# Patient Record
Sex: Male | Born: 1970 | Race: White | Hispanic: No | Marital: Married | Smoking: Never smoker
Health system: Southern US, Community
[De-identification: ages and names within clinical notes are randomized; demographics above are authoritative.]

## PROBLEM LIST (undated history)

## (undated) DIAGNOSIS — C801 Malignant (primary) neoplasm, unspecified: Secondary | ICD-10-CM

## (undated) DIAGNOSIS — C641 Malignant neoplasm of right kidney, except renal pelvis: Secondary | ICD-10-CM

## (undated) HISTORY — PX: KNEE SURGERY: SHX244

## (undated) HISTORY — PX: PARTIAL NEPHRECTOMY: SHX414

## (undated) HISTORY — PX: ANTERIOR CRUCIATE LIGAMENT REPAIR: SHX115

## (undated) HISTORY — PX: MENISCUS REPAIR: SHX5179

## (undated) HISTORY — PX: VASECTOMY: SHX75

---

## 2005-12-29 ENCOUNTER — Ambulatory Visit: Payer: Self-pay | Admitting: Otolaryngology

## 2006-07-04 ENCOUNTER — Ambulatory Visit: Payer: Self-pay | Admitting: General Practice

## 2006-08-29 ENCOUNTER — Ambulatory Visit: Payer: Self-pay | Admitting: Orthopaedic Surgery

## 2007-03-02 ENCOUNTER — Ambulatory Visit: Payer: Self-pay | Admitting: Otolaryngology

## 2009-07-18 ENCOUNTER — Ambulatory Visit: Payer: Self-pay | Admitting: Endocrinology

## 2009-07-22 ENCOUNTER — Ambulatory Visit: Payer: Self-pay | Admitting: Endocrinology

## 2009-09-19 ENCOUNTER — Ambulatory Visit: Payer: Self-pay | Admitting: Internal Medicine

## 2009-09-26 ENCOUNTER — Ambulatory Visit: Payer: Self-pay | Admitting: Internal Medicine

## 2009-10-06 ENCOUNTER — Ambulatory Visit: Payer: Self-pay | Admitting: Internal Medicine

## 2009-10-11 ENCOUNTER — Ambulatory Visit: Payer: Self-pay | Admitting: Oncology

## 2009-10-11 HISTORY — PX: PARTIAL NEPHRECTOMY: SHX414

## 2009-10-21 ENCOUNTER — Ambulatory Visit: Payer: Self-pay | Admitting: Oncology

## 2009-11-11 ENCOUNTER — Ambulatory Visit: Payer: Self-pay | Admitting: Oncology

## 2009-12-09 ENCOUNTER — Ambulatory Visit: Payer: Self-pay | Admitting: Internal Medicine

## 2009-12-29 ENCOUNTER — Ambulatory Visit: Payer: Self-pay | Admitting: Internal Medicine

## 2009-12-29 ENCOUNTER — Ambulatory Visit: Payer: Self-pay | Admitting: Unknown Physician Specialty

## 2010-08-04 ENCOUNTER — Ambulatory Visit: Payer: Self-pay | Admitting: Internal Medicine

## 2011-09-23 ENCOUNTER — Ambulatory Visit: Payer: Self-pay | Admitting: Internal Medicine

## 2012-11-09 ENCOUNTER — Emergency Department: Payer: Self-pay | Admitting: Emergency Medicine

## 2012-11-10 LAB — COMPREHENSIVE METABOLIC PANEL
Albumin: 3.9 g/dL (ref 3.4–5.0)
Alkaline Phosphatase: 67 U/L (ref 50–136)
Anion Gap: 10 (ref 7–16)
Chloride: 102 mmol/L (ref 98–107)
Creatinine: 1.1 mg/dL (ref 0.60–1.30)
EGFR (African American): >60
EGFR (Non-African Amer.): >60
Glucose: 143 mg/dL — ABNORMAL HIGH (ref 65–99)
SGOT(AST): 32 U/L (ref 15–37)
Total Protein: 7.5 g/dL (ref 6.4–8.2)

## 2012-11-10 LAB — CBC WITH DIFFERENTIAL/PLATELET
Basophil #: 0.1 10*3/uL (ref 0.0–0.1)
Basophil %: 0.7 %
Eosinophil #: 0.1 10*3/uL (ref 0.0–0.7)
HCT: 42.4 % (ref 40.0–52.0)
HGB: 15 g/dL (ref 13.0–18.0)
MCHC: 35.5 g/dL (ref 32.0–36.0)
MCV: 83 fL (ref 80–100)
Monocyte #: 0.9 x10 3/mm (ref 0.2–1.0)
Neutrophil #: 6.5 10*3/uL (ref 1.4–6.5)
Neutrophil %: 64.6 %
RBC: 5.12 10*6/uL (ref 4.40–5.90)
RDW: 13.7 % (ref 11.5–14.5)
WBC: 10.1 10*3/uL (ref 3.8–10.6)

## 2013-02-28 ENCOUNTER — Ambulatory Visit: Payer: Self-pay | Admitting: Orthopedic Surgery

## 2013-11-13 ENCOUNTER — Ambulatory Visit: Payer: Self-pay | Admitting: Orthopedic Surgery

## 2015-02-01 NOTE — Op Note (Signed)
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   PROCEDURE PERFORMED:  Arthroscopy, left knee, debridement of residual anterior cruciate ligament, extensive synovectomy, chondroplasty, partial medial meniscectomy.   SURGEON:  Dawayne Patricia M.D.   ASSISTANT:  None.   ANESTHESIA:  General anesthesia.   TOURNIQUET TIME:  Zero.  ESTIMATED BLOOD LOSS:  Minimal.   FINDINGS:  Grade IV chondral wear on the medial femoral condyle, grade II chondral wear medial tibial plateau. Grade II chondral wear lateral femoral condyle and lateral tibial plateau. Grade IV chondral wear femoral trochlea. Extensive synovitis. Chronic tear ACL with significant scarring of residual stump, complex free edge flap tear of posterior horn medial meniscus.   COMPLICATIONS:  None.   INDICATIONS FOR PROCEDURE:  Jesse Montgomery is a 44 year old gentleman who has an extensive history of injury to the left knee. He has undergone two ACL reconstruction and does not know at which point his second ligament reconstruction failed. He has had multiple episodes of instability of the knee. He has recently begun to have significant pain in the knee and episodes of his knee giving way and a catching and locking sensation. MRI revealed that the ACL was no longer present. MRI also revealed extensive degenerative changes as well as a medial meniscus tear. The patient opted to proceed with surgical intervention. The risks and benefits were explained to the patient. The patient decided to proceed with surgery.   DESCRIPTION OF PROCEDURE:  Jesse Montgomery was brought into the Operating Room. Placed on table in a supine position. General  anesthesia was administered. The left lower extremity was prepared and draped in the usual sterile fashion. A tourniquet was applied, however, it was not inflated. Surgical time-out was performed. The patient was identified, laterality was confirmed, consent form of confirmed, procedure, administration of IV antibiotics and appropriate skin preparation.   A standard lateral viewing portal was made. Arthroscope was inserted. Under direct visualization, a medial portal was made. The patellofemoral articulation was evaluated. The patient had very extensive synovitis. An extensive synovectomy was performed in order to achieve improve visualization. The undersurface of the patella was probed and found to be quite soft; however, there was no significant chondral loss. The femoral trochlear groove had very extensive grade IV chondral wear. This was probed and multiple areas of loose cartilage were found. These were gently debrided back with a chondral shaver back to a stable edge.   The medial compartment was entered. The medial compartment demonstrated grade IV chondral wear on the medial femoral condyle. Multiple loose fragments of cartilage were found. These were debrided back to a stable edge. The meniscus was probed. The posterior horn demonstrated a complex tear with a large, unstable free flap. This was debrided using a combination of meniscal biters and shaver. Attention was now turned to the notch. The patient demonstrated an empty lateral wall sign. The ACL was not present. There was a large residual stump of the ACL and that was scarred to the PCL and densely adherent to the lateral wall. This was debrided away. Prior to debriding, the patient's knee was extended and some of the residual ACL stump was found to be impinging on the notch. There was one area of very dense, firm-feeling  tissue that was removed using a grasper from the tibia. This was found to be consistent with a chondral loose body enveloped in  soft tissue. Attention was now turned to the lateral compartment. The lateral compartment demonstrated mild degenerative changes at approximately a grade II. The meniscus was probed and found to be stable with no evidence of tearing. At this time, the knee was copiously irrigated. The medial and lateral gutters were checked for any residual loose bodies. After any loose bodies were evacuated, the knee was again irrigated and drained. The portals were closed using 3-0 nylon suture. Sterile dressings were applied. The patient was awoken and taken to the postoperative care unit in stable condition.      He will follow up in the office on Friday and begin physical therapy the same day.   ____________________________ Dawayne Patricia, MD sr:jm D: 11/16/2013 15:24:11 ET T: 11/16/2013 18:07:52 ET JOB#: [id number]  cc: Dawayne Patricia, MD, <Dictator> Dawayne Patricia MD ELECTRONICALLY SIGNED 12/13/2013 8:49

## 2015-06-25 ENCOUNTER — Other Ambulatory Visit: Payer: Self-pay | Admitting: Physician Assistant

## 2015-06-25 DIAGNOSIS — R911 Solitary pulmonary nodule: Secondary | ICD-10-CM

## 2015-07-08 ENCOUNTER — Ambulatory Visit
Admission: RE | Admit: 2015-07-08 | Discharge: 2015-07-08 | Disposition: A | Discharge status: Home / Self Care | Payer: BLUE CROSS/BLUE SHIELD | Source: Ambulatory Visit | Attending: Physician Assistant | Admitting: Physician Assistant

## 2015-07-08 DIAGNOSIS — R911 Solitary pulmonary nodule: Secondary | ICD-10-CM | POA: Diagnosis not present

## 2015-07-08 HISTORY — DX: Malignant (primary) neoplasm, unspecified: C80.1

## 2015-07-08 MED ORDER — IOHEXOL 300 MG/ML  SOLN
75.0000 mL | Freq: Once | INTRAMUSCULAR | Status: AC | PRN
  Administered 2015-07-08: 75 mL via INTRAVENOUS

## 2015-09-09 ENCOUNTER — Encounter
Admission: RE | Disposition: A | Discharge status: Home / Self Care | Payer: Self-pay | Source: Ambulatory Visit | Attending: Internal Medicine

## 2015-09-09 ENCOUNTER — Ambulatory Visit
Admission: RE | Admit: 2015-09-09 | Discharge: 2015-09-09 | Disposition: A | Discharge status: Home / Self Care | Payer: BLUE CROSS/BLUE SHIELD | Source: Ambulatory Visit | Attending: Internal Medicine | Admitting: Internal Medicine

## 2015-09-09 DIAGNOSIS — R911 Solitary pulmonary nodule: Secondary | ICD-10-CM | POA: Diagnosis not present

## 2015-09-09 HISTORY — PX: FLEXIBLE BRONCHOSCOPY: SHX5094

## 2015-09-09 SURGERY — BRONCHOSCOPY, FLEXIBLE
Anesthesia: Moderate Sedation

## 2015-09-09 MED ORDER — SODIUM CHLORIDE 0.9 % IV SOLN
INTRAVENOUS | Status: DC
  Administered 2015-09-09: 12:00:00 via INTRAVENOUS

## 2015-09-09 MED ORDER — MIDAZOLAM HCL 2 MG/2ML IJ SOLN
INTRAMUSCULAR | Status: AC | PRN
  Administered 2015-09-09 (×2): 2 mg via INTRAVENOUS
  Administered 2015-09-09: 1 mg via INTRAVENOUS

## 2015-09-09 MED ORDER — MIDAZOLAM HCL 5 MG/5ML IJ SOLN
INTRAMUSCULAR | Status: AC
  Filled 2015-09-09: qty 10

## 2015-09-09 MED ORDER — FENTANYL CITRATE (PF) 100 MCG/2ML IJ SOLN
INTRAMUSCULAR | Status: AC
  Filled 2015-09-09: qty 2

## 2015-09-09 MED ORDER — FENTANYL CITRATE (PF) 100 MCG/2ML IJ SOLN
INTRAMUSCULAR | Status: AC | PRN
  Administered 2015-09-09 (×2): 50 ug via INTRAVENOUS

## 2015-09-09 MED ORDER — LIDOCAINE HCL 2 % EX GEL: 1.0000 "application " | Freq: Once | CUTANEOUS | Status: DC

## 2015-09-09 NOTE — Op Note (Signed)
[facility] Patient Name: Jesse Montgomery Procedure Date: 09/09/2015 12:07 PM MRN: [id number] Account #: [id number] Date of Birth: [birth year] Admit Type: Outpatient Age: 44 Room: [facility] PROCEDURE RM 01 Gender: Male Note Status: Finalized Attending MD: Allyne Gee, MD Procedure:         Bronchoscopy Indications:       Suspicious lung lesion Providers:         Allyne Gee, MD Referring MD:       Medicines:         Midazolam 5 mg IV, Fentanyl 123XX123 mcg IV Complications:     No immediate complications Procedure:         Pre-Anesthesia Assessment:                    - The risks and benefits of the procedure and the sedation                     options and risks were discussed with the patient. All                     questions were answered and informed consent was obtained.                    - A History and Physical has been performed. The patient's                     medications, allergies and sensitivities have been                     reviewed.                    - Pre-procedure physical examination revealed no                     contraindications to sedation.                    - ASA Grade Assessment: II - A patient with mild systemic                     disease.                    - After reviewing the risks and benefits, the patient was                     deemed in satisfactory condition to undergo the procedure.                    - The anesthesia plan was to use moderate                     sedation/analgesia.                    - Immediately prior to administration of medications, the                     patient was re-assessed for adequacy to receive sedatives.                    - The heart rate, respiratory rate, oxygen saturations,                     blood pressure, adequacy of pulmonary ventilation, and  response to care were monitored throughout the procedure.                    - The physical status of the patient was  re-assessed after                     the procedure.                    After obtaining informed consent, the bronchoscope was                     passed under direct vision. Throughout the procedure, the                     patient's blood pressure, pulse, and oxygen saturations                     were monitored continuously. the Bronchoscope Olympus                     BF-Q180 S# [id number] was introduced through the mouth and                     advanced to the tracheobronchial tree of both lungs. The                     procedure was accomplished without difficulty. The patient                     tolerated the procedure well. The total duration of the                     procedure was 22 minutes. Findings:      The nasopharynx/oropharynx appears normal. The larynx appears normal.       The vocal cords move normally with phonation and breathing. The       subglottic space is normal. The trachea is of normal caliber. The carina       is sharp. The tracheobronchial tree was examined to at least the first       subsegmental level. Bronchial mucosa and anatomy are normal; there are       no endobronchial lesions, and no secretions. There was no evidence of       significant pathology. Washings were obtained in the right middle lobe       and sent for cell count, bacterial culture, viral smears & culture, and       fungal & AFB analysis and cytology. The return was clear. Impression:        - The examination was normal.                    - Washings were obtained. Recommendation:    - Await culture and washing results. Devona Konig, MD Allyne Gee, MD 09/09/2015 12:49:27 PM This report has been signed electronically. Number of Addenda: 0 Note Initiated On: 09/09/2015 12:07 PM      [facility]

## 2015-09-09 NOTE — H&P (Signed)
Pulmonary Critical Care  Procedure H&P   Jesse Montgomery [id number] DOB: [birth year] DOA: 09/09/2015  PCP: Lenard Simmer, MD   Chief Complaint: here for bronchoscopy  HPI: Jesse Montgomery is a 44 y.o. male with prior history of renal carcinoma presents for a bronchoscopy. Please prior detailed H&P in chart. He is doing well with no significant changes or complaints at this time. Patient is comfortable and will proceed with the procedure   Review of Systems:  Complete ROS is unremarkable other than noted in HPI  Past Medical History  Diagnosis Date  . Cancer [facility])    History reviewed. No pertinent past surgical history. Social History:  reports that he has never smoked. He does not have any smokeless tobacco history on file. He reports that he does not use illicit drugs. His alcohol history is not on file.  Not on File  History reviewed. No pertinent family history.  Prior to Admission medications   Not on File   Physical Exam: Filed Vitals:   09/09/15 1120  BP: 144/98  Pulse: 76  Temp: 98.6 F (37 C)  TempSrc: Oral  Resp: 17  Height: 5\' 10"  (1.778 m)  Weight: 99.791 kg (220 lb)  SpO2: 95%    Wt Readings from Last 3 Encounters:  09/09/15 99.791 kg (220 lb)    General:  Appears calm and comfortable Eyes: PERRL, normal lids, irises & conjunctiva ENT: grossly normal hearing, lips & tongue Neurologic: grossly non-focal. Remainder exam unchanged          Labs on Admission:  Basic Metabolic Panel: No results for input(s): NA, K, CL, CO2, GLUCOSE, BUN, CREATININE, CALCIUM, MG, PHOS in the last 168 hours. Liver Function Tests: No results for input(s): AST, ALT, ALKPHOS, BILITOT, PROT, ALBUMIN in the last 168 hours. No results for input(s): LIPASE, AMYLASE in the last 168 hours. No results for input(s): AMMONIA in the last 168 hours. CBC: No results for input(s): WBC, NEUTROABS, HGB, HCT, MCV, PLT in the last 168 hours. Cardiac Enzymes: No results for  input(s): CKTOTAL, CKMB, CKMBINDEX, TROPONINI in the last 168 hours.  BNP (last 3 results) No results for input(s): BNP in the last 8760 hours.  ProBNP (last 3 results) No results for input(s): PROBNP in the last 8760 hours.  CBG: No results for input(s): GLUCAP in the last 168 hours.  Radiological Exams on Admission: No results found.  EKG: Independently reviewed.  Assessment/Plan Active Problems:   * No active hospital problems. *   1. Bronchoscopy -patient to have bronch today will proceed       I have personally obtained a history, examined the patient.   Allyne Gee, MD [facility] Pulmonary Critical Care Medicine Sleep Medicine

## 2015-09-09 NOTE — Sedation Documentation (Signed)
Patient received total of 5mg  versed and 170mcg of fentanyl during procedure. Tolerated well, will transfer back to [facility] for recovery.

## 2015-09-09 NOTE — Progress Notes (Signed)
Tlerated procedure well. Awake, alert, oriented x 4. Reporting no pain. VSS and have returned to baseline. Discharge reviewed with patient and wife. Discharge to home per orders. Will escort out via wheelchair.

## 2015-09-13 LAB — CULTURE, RESPIRATORY

## 2015-09-13 LAB — CULTURE, RESPIRATORY W GRAM STAIN

## 2015-09-30 LAB — FUNGUS CULTURE W SMEAR

## 2015-10-23 LAB — ACID FAST SMEAR+CULTURE W/RFLX (ARMC ONLY)
ACID FAST SMEAR: NEGATIVE
Acid Fast Culture: NEGATIVE

## 2020-09-10 HISTORY — PX: TOTAL KNEE ARTHROPLASTY: SHX125

## 2021-08-05 ENCOUNTER — Encounter: Payer: Self-pay | Admitting: Emergency Medicine

## 2021-08-05 ENCOUNTER — Emergency Department: Payer: Managed Care, Other (non HMO)

## 2021-08-05 ENCOUNTER — Other Ambulatory Visit: Payer: Self-pay

## 2021-08-05 DIAGNOSIS — I10 Essential (primary) hypertension: Secondary | ICD-10-CM | POA: Insufficient documentation

## 2021-08-05 DIAGNOSIS — G43009 Migraine without aura, not intractable, without status migrainosus: Secondary | ICD-10-CM | POA: Diagnosis not present

## 2021-08-05 DIAGNOSIS — Z859 Personal history of malignant neoplasm, unspecified: Secondary | ICD-10-CM | POA: Insufficient documentation

## 2021-08-05 DIAGNOSIS — R519 Headache, unspecified: Secondary | ICD-10-CM | POA: Diagnosis present

## 2021-08-05 LAB — BASIC METABOLIC PANEL
Anion gap: 10 (ref 5–15)
BUN: 19 mg/dL (ref 6–20)
CO2: 27 mmol/L (ref 22–32)
Calcium: 9.1 mg/dL (ref 8.9–10.3)
Chloride: 98 mmol/L (ref 98–111)
Creatinine, Ser: 1.15 mg/dL (ref 0.61–1.24)
GFR, Estimated: >60 mL/min (ref >60)
Glucose, Bld: 201 mg/dL — ABNORMAL HIGH (ref 70–99)
Potassium: 3.8 mmol/L (ref 3.5–5.1)
Sodium: 135 mmol/L (ref 135–145)

## 2021-08-05 LAB — CBC
HCT: 43.3 % (ref 39.0–52.0)
Hemoglobin: 16 g/dL (ref 13.0–17.0)
MCH: 29.7 pg (ref 26.0–34.0)
MCHC: 37 g/dL — ABNORMAL HIGH (ref 30.0–36.0)
MCV: 80.5 fL (ref 80.0–100.0)
Platelets: 219 10*3/uL (ref 150–400)
RBC: 5.38 MIL/uL (ref 4.22–5.81)
RDW: 12.6 % (ref 11.5–15.5)
WBC: 10.7 10*3/uL — ABNORMAL HIGH (ref 4.0–10.5)
nRBC: 0 % (ref 0.0–0.2)

## 2021-08-05 IMAGING — CT CT HEAD W/O CM
4 series · 16 of 47 positions shown, 18 images · non-contrast
Comparison: November 09, 2012

CLINICAL DATA: Headache and hypertension.

EXAM:
CT HEAD WITHOUT CONTRAST
TECHNIQUE: Contiguous axial images were obtained from the base of the skull
through the vertex without intravenous contrast.

[Series 2: head bone · axial · 0.46mm/px · z∈[-116,-84]mm · 3 of 79 slices shown]
[im 8/79  bone]
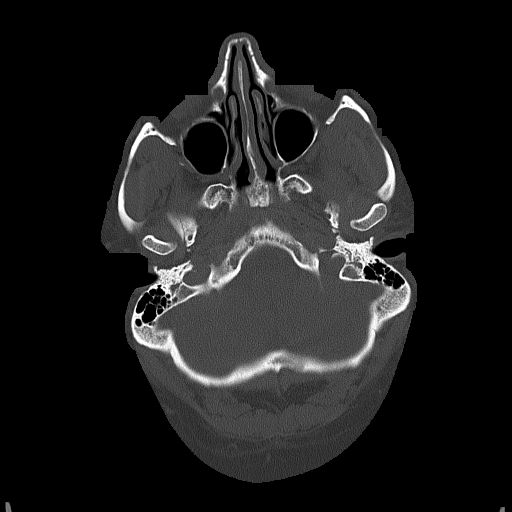
[im 16/79  bone]
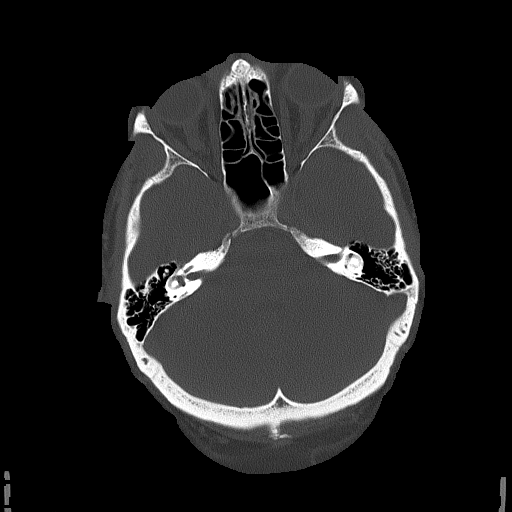
[im 24/79  bone]
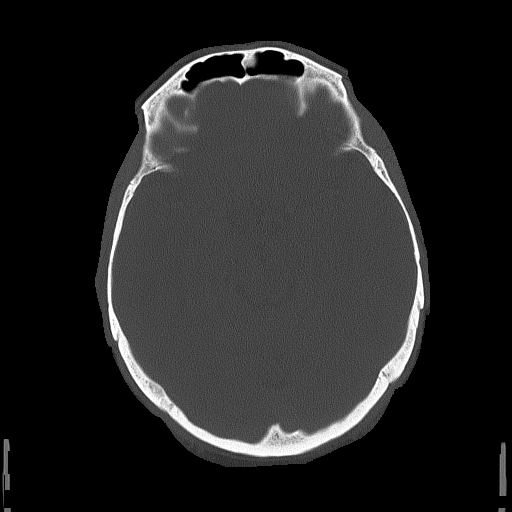

[Series 3: head wo · axial · 0.46mm/px · z∈[-115,+5]mm · 7 of 32 slices shown, 9 images]
[im 4/32  brain]
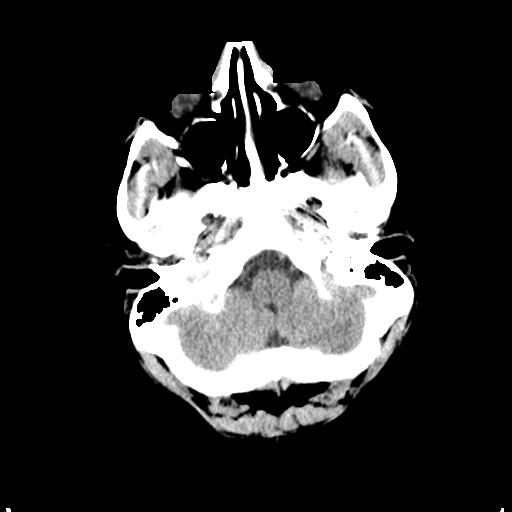
[im 4/32  bone]
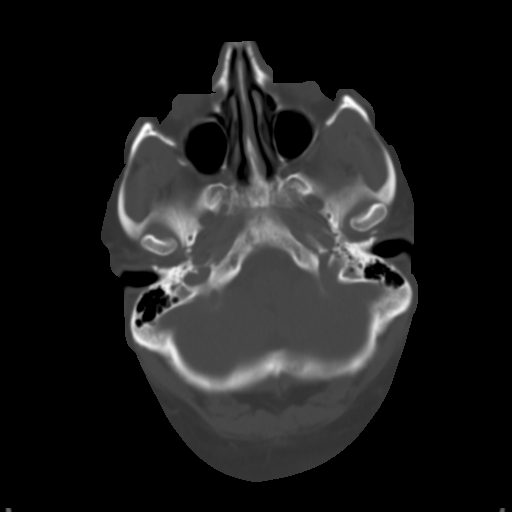
[im 8/32  brain]
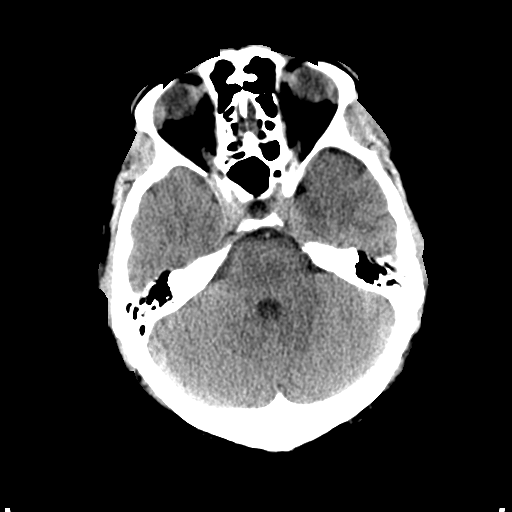
[im 12/32  brain]
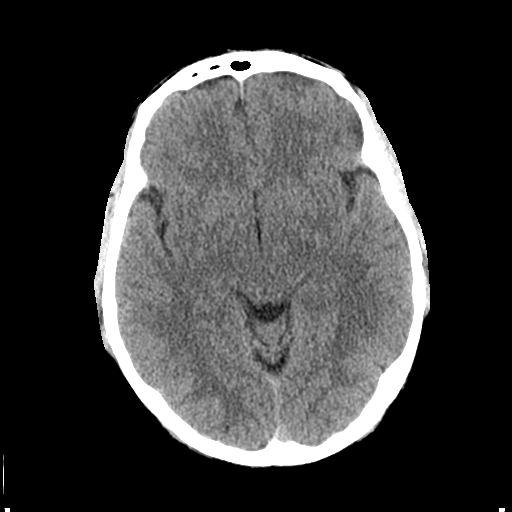
[im 16/32  brain]
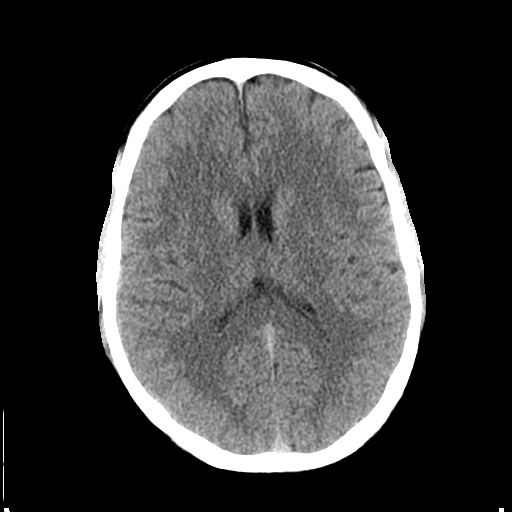
[im 20/32  brain]
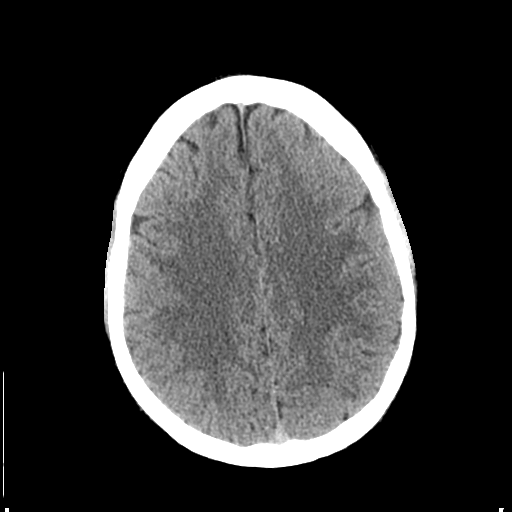
[im 20/32  bone]
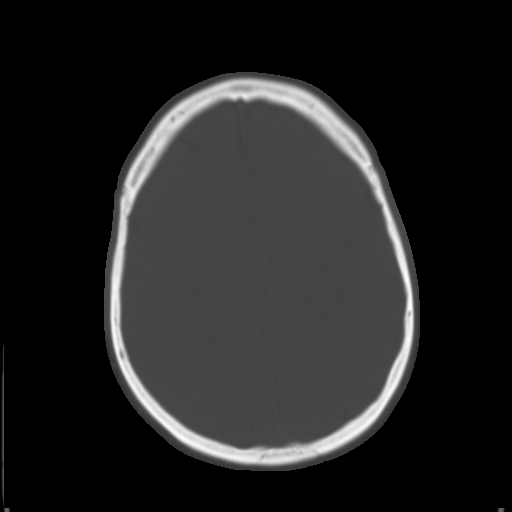
[im 24/32  brain]
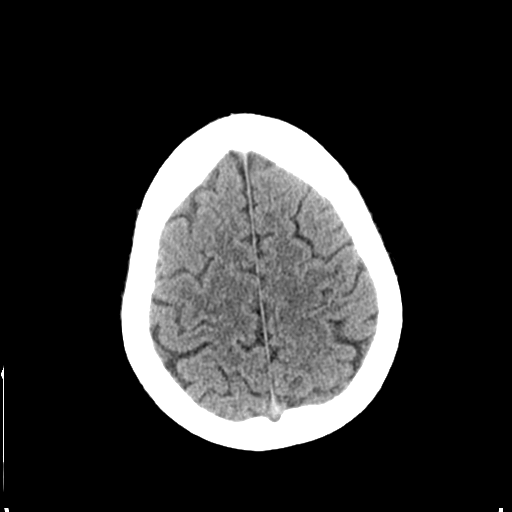
[im 28/32  brain]
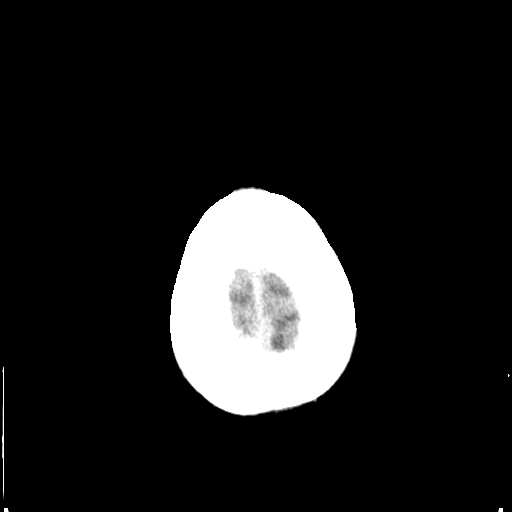

[Series 4: coronal soft tissue · coronal · 0.30mm/px · 3 of 71 slices shown]
[im 24/71  brain]
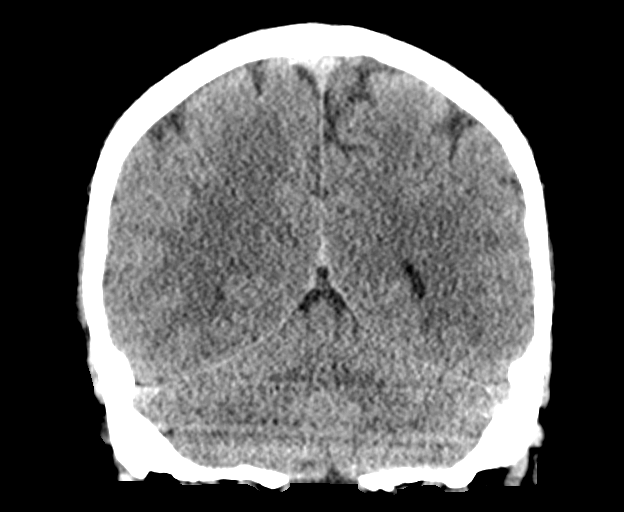
[im 32/71  brain]
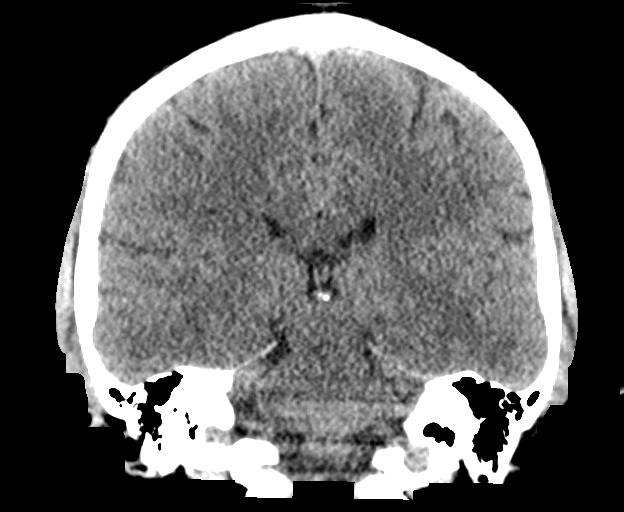
[im 39/71  brain]
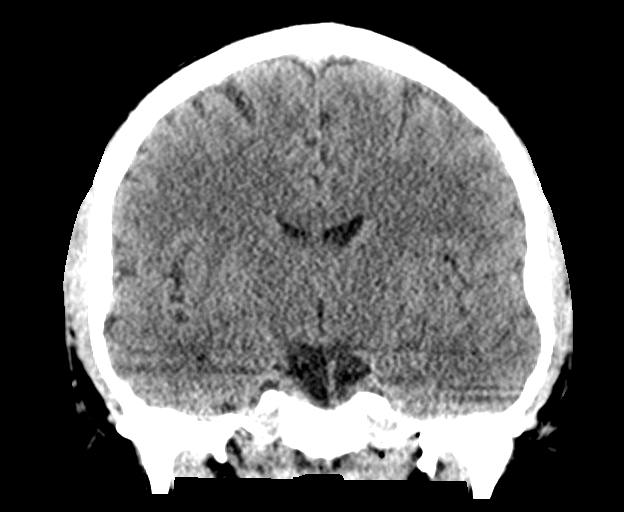

[Series 5: sagittal soft tissue · sagittal · 0.30mm/px · 3 of 63 slices shown]
[im 21/63  brain]
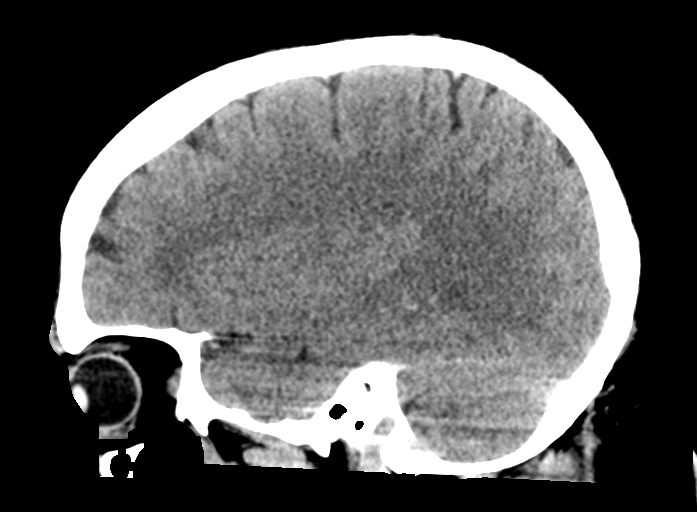
[im 32/63  brain]
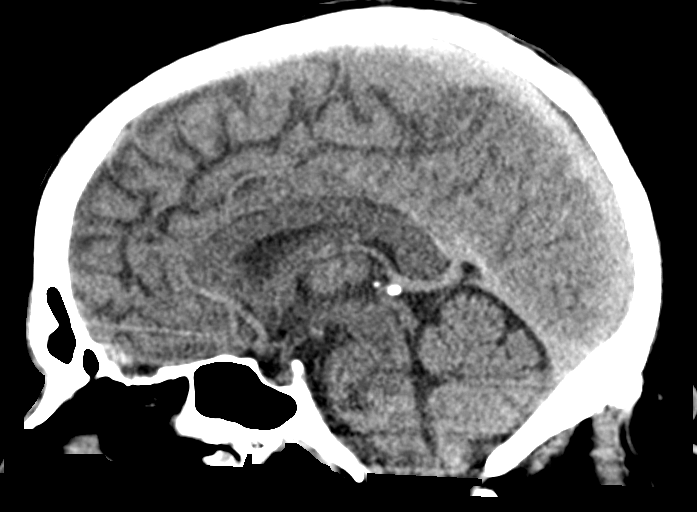
[im 42/63  brain]
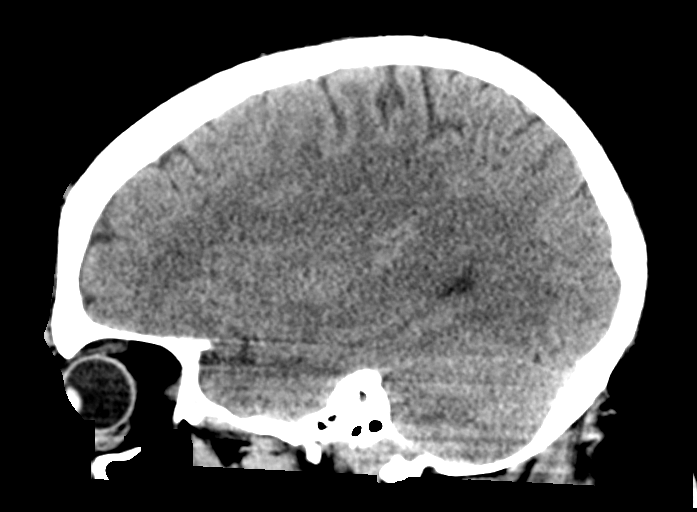

[16 of 47 positions shown; findings below may reference images not displayed]

FINDINGS: Brain: No evidence of acute infarction, hemorrhage, hydrocephalus,
extra-axial collection or mass lesion/mass effect.

Vascular: No hyperdense vessel or unexpected calcification.

Skull: Normal. Negative for fracture or focal lesion.

Sinuses/Orbits: No acute finding.

Other: None.
IMPRESSION: No acute intracranial pathology.

## 2021-08-05 MED ORDER — ONDANSETRON 4 MG PO TBDP
4.0000 mg | ORAL_TABLET | Freq: Once | ORAL | Status: AC
  Administered 2021-08-05: 4 mg via ORAL
  Filled 2021-08-05: qty 1

## 2021-08-05 NOTE — ED Provider Notes (Signed)
Emergency Medicine Provider Triage Evaluation Note  Jesse Montgomery , a 50 y.o. male  was evaluated in triage.  Pt complains of severe headache, pressure that began around 4 PM.  An hour and a half later he developed vomiting.  Has had several episodes of vomiting.  Denies any vision changes, fevers, chills or body aches.  No focal weakness.  He is feeling weak after several episodes of vomiting.  Wife is noted he has been hypertensive around 160/105.  No history of hypertension.  Patient states this is the worst headache of his life.  Review of Systems  Positive: Headache, vomiting Negative: Vision changes, chest pain, shortness of breath, numbness, neck pain  Physical Exam  BP (!) 165/105 (BP Location: Left Arm)   Pulse 71   Temp 98.2 F (36.8 C) (Oral)   Resp 16   Ht 5\' 9"  (1.753 m)   Wt 102.1 kg   SpO2 96%   BMI 33.23 kg/m  Gen:   Awake, no distress   Resp:  Normal effort  MSK:   Moves extremities without difficulty  Other:    Medical Decision Making  Medically screening exam initiated at 10:01 PM.  Appropriate orders placed.  FLAVIO LINDROTH was informed that the remainder of the evaluation will be completed by another provider, this initial triage assessment does not replace that evaluation, and the importance of remaining in the ED until their evaluation is complete.  Ordered CT of the head and basic blood work.  Will give Zofran for nausea.   Renata Caprice 08/05/21 2206    Merlyn Lot, MD 08/05/21 [phone]

## 2021-08-05 NOTE — ED Triage Notes (Signed)
Pt to ED from home c/o headache and HTN that started today.  States second episode this week.  Had vomiting x6 today.  Denies injury, fever, or other pain.  Pt A&Ox4, no weakness or numbness.  Took tylenol around 1600 today.  Denies hx of HTN, checked BP at home because wife has machine.

## 2021-08-06 ENCOUNTER — Emergency Department
Admission: EM | Admit: 2021-08-06 | Discharge: 2021-08-06 | Disposition: A | Discharge status: Home / Self Care | Payer: Managed Care, Other (non HMO) | Attending: Student in an Organized Health Care Education/Training Program | Admitting: Student in an Organized Health Care Education/Training Program

## 2021-08-06 ENCOUNTER — Telehealth: Payer: Self-pay | Admitting: Emergency Medicine

## 2021-08-06 DIAGNOSIS — G43009 Migraine without aura, not intractable, without status migrainosus: Secondary | ICD-10-CM

## 2021-08-06 DIAGNOSIS — R112 Nausea with vomiting, unspecified: Secondary | ICD-10-CM

## 2021-08-06 DIAGNOSIS — R519 Headache, unspecified: Secondary | ICD-10-CM

## 2021-08-06 DIAGNOSIS — I10 Essential (primary) hypertension: Secondary | ICD-10-CM

## 2021-08-06 MED ORDER — ONDANSETRON 4 MG PO TBDP: 4.0000 mg | ORAL_TABLET | Freq: Three times a day (TID) | ORAL | 0 refills | Status: DC | PRN

## 2021-08-06 MED ORDER — BUTALBITAL-APAP-CAFFEINE 50-325-40 MG PO TABS: 1.0000 | ORAL_TABLET | Freq: Four times a day (QID) | ORAL | 0 refills | Status: DC | PRN

## 2021-08-06 MED ORDER — BUTALBITAL-APAP-CAFFEINE 50-325-40 MG PO TABS
2.0000 | ORAL_TABLET | Freq: Once | ORAL | Status: AC
  Administered 2021-08-06: 2 via ORAL
  Filled 2021-08-06: qty 2

## 2021-08-06 NOTE — ED Notes (Signed)
Patient discharged to home per MD order. Patient in stable condition, and deemed medically cleared by ED provider for discharge. Discharge instructions reviewed with patient/family using "Teach Back"; verbalized understanding of medication education and administration, and information about follow-up care. Denies further concerns. ° °

## 2021-08-06 NOTE — ED Provider Notes (Signed)
[facility] Emergency Department Provider Note   ____________________________________________   Event Date/Time   First MD Initiated Contact with Patient 08/05/21 2201     (approximate)  I have reviewed the triage vital signs and the nursing notes.   HISTORY  Chief Complaint Hypertension and Headache    HPI Jesse Montgomery is a 50 y.o. male who presents for headache  LOCATION: Generalized head DURATION: Began at approximately 1600 today TIMING: Stable since onset SEVERITY: Severe QUALITY: Head pressure CONTEXT: Patient states that beginning around 4 PM today he began having a severe headache and an hour and a half later developed vomiting as well.  Patient's wife also notes him to be hypertensive without any history of hypertension MODIFYING FACTORS: Denies any exacerbating or relieving factors ASSOCIATED SYMPTOMS: Nausea/vomiting, hypertension   Per medical record review, patient has no contributory past medical history          Past Medical History:  Diagnosis Date   Cancer ([place])     There are no problems to display for this patient.   Past Surgical History:  Procedure Laterality Date   FLEXIBLE BRONCHOSCOPY N/A 09/09/2015   Procedure: FLEXIBLE BRONCHOSCOPY;  Surgeon: Allyne Gee, MD;  Location: [facility] ORS;  Service: Pulmonary;  Laterality: N/A;   KNEE SURGERY Left    PARTIAL NEPHRECTOMY      Prior to Admission medications   Medication Sig Start Date End Date Taking? Authorizing Provider  butalbital-acetaminophen-caffeine (FIORICET) 50-325-40 MG tablet Take 1-2 tablets by mouth every 6 (six) hours as needed for headache. 08/06/21 08/06/22 Yes Rukaya Kleinschmidt, Vista Lawman, MD  ondansetron (ZOFRAN ODT) 4 MG disintegrating tablet Take 1 tablet (4 mg total) by mouth every 8 (eight) hours as needed for nausea or vomiting. 08/06/21  Yes Naaman Plummer, MD    Allergies Patient has no known allergies.  History reviewed. No pertinent family  history.  Social History Social History   Tobacco Use   Smoking status: Never   Smokeless tobacco: Never  Substance Use Topics   Alcohol use: Yes    Alcohol/week: 8.0 standard drinks    Types: 8 Cans of beer per week    Comment: occ   Drug use: No    Review of Systems Constitutional: No fever/chills Eyes: No visual changes. ENT: No sore throat. Cardiovascular: Denies chest pain. Respiratory: Denies shortness of breath. Gastrointestinal: No abdominal pain.  Endorses nausea and vomiting.  No diarrhea. Genitourinary: Negative for dysuria. Musculoskeletal: Negative for acute arthralgias Skin: Negative for rash. Neurological: Positive for headache, weakness/numbness/paresthesias in any extremity Psychiatric: Negative for suicidal ideation/homicidal ideation   ____________________________________________   PHYSICAL EXAM:  VITAL SIGNS: ED Triage Vitals [08/05/21 2200]  Enc Vitals Group     BP (!) 165/105     Pulse Rate 71     Resp 16     Temp 98.2 F (36.8 C)     Temp Source Oral     SpO2 96 %     Weight 225 lb (102.1 kg)     Height 5\' 9"  (1.753 m)     Head Circumference      Peak Flow      Pain Score 10     Pain Loc      Pain Edu?      Excl. in [place]?    Constitutional: Alert and oriented. Well appearing and in no acute distress. Eyes: Conjunctivae are normal. PERRL. Head: Atraumatic. Nose: No congestion/rhinnorhea. Mouth/Throat: Mucous membranes are moist. Neck: No stridor  Cardiovascular: Grossly normal heart sounds.  Good peripheral circulation. Respiratory: Normal respiratory effort.  No retractions. Gastrointestinal: Soft and nontender. No distention. Musculoskeletal: No obvious deformities Neurologic:  Normal speech and language. No gross focal neurologic deficits are appreciated. Skin:  Skin is warm and dry. No rash noted. Psychiatric: Mood and affect are normal. Speech and behavior are normal.  ____________________________________________    LABS (all labs ordered are listed, but only abnormal results are displayed)  Labs Reviewed  CBC - Abnormal; Notable for the following components:      Result Value   WBC 10.7 (*)    MCHC 37.0 (*)    All other components within normal limits  BASIC METABOLIC PANEL - Abnormal; Notable for the following components:   Glucose, Bld 201 (*)    All other components within normal limits    RADIOLOGY  ED MD interpretation: CT of the head without contrast shows no evidence of acute abnormalities including no intracerebral hemorrhage, obvious masses, or significant edema  Official radiology report(s): CT Head Wo Contrast  Result Date: 08/05/2021 CLINICAL DATA:  Headache and hypertension. EXAM: CT HEAD WITHOUT CONTRAST TECHNIQUE: Contiguous axial images were obtained from the base of the skull through the vertex without intravenous contrast. COMPARISON:  November 09, 2012 FINDINGS: Brain: No evidence of acute infarction, hemorrhage, hydrocephalus, extra-axial collection or mass lesion/mass effect. Vascular: No hyperdense vessel or unexpected calcification. Skull: Normal. Negative for fracture or focal lesion. Sinuses/Orbits: No acute finding. Other: None. IMPRESSION: No acute intracranial pathology. Electronically Signed   By: Virgina Norfolk M.D.   On: 08/05/2021 22:22    ____________________________________________   PROCEDURES  Procedure(s) performed (including Critical Care):  Procedures   ____________________________________________   INITIAL IMPRESSION / ASSESSMENT AND PLAN / ED COURSE  As part of my medical decision making, I reviewed the following data within the electronic medical record, if available:  Nursing notes reviewed and incorporated, Labs reviewed, EKG interpreted, Old chart reviewed, Radiograph reviewed and Notes from prior ED visits reviewed and incorporated      50 year old male presents with Headache.  No focal neurological symptoms. Neuro exam is benign. Pt  is nontoxic. VSS.  Based on history and normal neurological exam I have low suspicion for intracranial tumor, intracranial bleed, meningitis, temporal arteritis, glaucoma, CO poisoning.  Most likely patient has benign headache/migraine, recommend rest, hydration, and ibuprofen.  Disposition: Discharge home with strict return precautions and instructions for prompt primary care follow up.      ____________________________________________   FINAL CLINICAL IMPRESSION(S) / ED DIAGNOSES  Final diagnoses:  Bad headache  Migraine without aura and without status migrainosus, not intractable  Nausea and vomiting, unspecified vomiting type  Primary hypertension     ED Discharge Orders          Ordered    butalbital-acetaminophen-caffeine (FIORICET) 50-325-40 MG tablet  Every 6 hours PRN        08/06/21 0228    ondansetron (ZOFRAN ODT) 4 MG disintegrating tablet  Every 8 hours PRN        08/06/21 0228    butalbital-acetaminophen-caffeine (FIORICET) 50-325-40 MG tablet  Every 6 hours PRN        Pending             Note:  This document was prepared using Dragon voice recognition software and may include unintentional dictation errors.    Naaman Plummer, MD 08/06/21 [phone]

## 2021-08-06 NOTE — Addendum Note (Signed)
Addended by: Delman Kitten on: 08/06/2021 09:53 AM   Modules accepted: Orders

## 2021-08-06 NOTE — Telephone Encounter (Addendum)
[facility] called advised they do not have a pharmacist today.  Patient unable to get his prescription filled at that location.  Will provide printed prescriptions for prescriptions provided earlier by Dr. Cheri Fowler  Update: Due to unclear error, unable to submit to pharmacy or print prescription for patient despite repeat attempts. IT contacted and Charge RN Mickel Baas) made aware.

## 2022-03-09 ENCOUNTER — Other Ambulatory Visit: Payer: Self-pay

## 2022-03-09 ENCOUNTER — Emergency Department
Admission: EM | Admit: 2022-03-09 | Discharge: 2022-03-09 | Disposition: A | Discharge status: Home / Self Care | Payer: Managed Care, Other (non HMO) | Attending: Emergency Medicine | Admitting: Emergency Medicine

## 2022-03-09 ENCOUNTER — Encounter: Payer: Self-pay | Admitting: Radiology

## 2022-03-09 ENCOUNTER — Emergency Department: Payer: Managed Care, Other (non HMO)

## 2022-03-09 DIAGNOSIS — R109 Unspecified abdominal pain: Secondary | ICD-10-CM

## 2022-03-09 DIAGNOSIS — K429 Umbilical hernia without obstruction or gangrene: Secondary | ICD-10-CM | POA: Diagnosis not present

## 2022-03-09 LAB — URINALYSIS, ROUTINE W REFLEX MICROSCOPIC
Bacteria, UA: NONE SEEN
Bilirubin Urine: NEGATIVE
Glucose, UA: 50 mg/dL — AB
Hgb urine dipstick: NEGATIVE
Ketones, ur: NEGATIVE mg/dL
Leukocytes,Ua: NEGATIVE
Nitrite: NEGATIVE
Protein, ur: 30 mg/dL — AB
Specific Gravity, Urine: 1.03 (ref 1.005–1.030)
pH: 5 (ref 5.0–8.0)

## 2022-03-09 LAB — CBC
HCT: 42.1 % (ref 39.0–52.0)
Hemoglobin: 14.6 g/dL (ref 13.0–17.0)
MCH: 28.9 pg (ref 26.0–34.0)
MCHC: 34.7 g/dL (ref 30.0–36.0)
MCV: 83.2 fL (ref 80.0–100.0)
Platelets: 213 10*3/uL (ref 150–400)
RBC: 5.06 MIL/uL (ref 4.22–5.81)
RDW: 12.5 % (ref 11.5–15.5)
WBC: 8.7 10*3/uL (ref 4.0–10.5)
nRBC: 0 % (ref 0.0–0.2)

## 2022-03-09 LAB — COMPREHENSIVE METABOLIC PANEL
ALT: 26 U/L (ref 0–44)
AST: 21 U/L (ref 15–41)
Albumin: 4.2 g/dL (ref 3.5–5.0)
Alkaline Phosphatase: 62 U/L (ref 38–126)
Anion gap: 10 (ref 5–15)
BUN: 18 mg/dL (ref 6–20)
CO2: 27 mmol/L (ref 22–32)
Calcium: 9.1 mg/dL (ref 8.9–10.3)
Chloride: 101 mmol/L (ref 98–111)
Creatinine, Ser: 0.9 mg/dL (ref 0.61–1.24)
GFR, Estimated: >60 mL/min (ref >60)
Glucose, Bld: 170 mg/dL — ABNORMAL HIGH (ref 70–99)
Potassium: 4 mmol/L (ref 3.5–5.1)
Sodium: 138 mmol/L (ref 135–145)
Total Bilirubin: 1 mg/dL (ref 0.3–1.2)
Total Protein: 7.3 g/dL (ref 6.5–8.1)

## 2022-03-09 LAB — LIPASE, BLOOD: Lipase: 24 U/L (ref 11–51)

## 2022-03-09 MED ORDER — ONDANSETRON HCL 4 MG/2ML IJ SOLN
4.0000 mg | Freq: Once | INTRAMUSCULAR | Status: AC
  Administered 2022-03-09: 4 mg via INTRAVENOUS
  Filled 2022-03-09: qty 2

## 2022-03-09 MED ORDER — MORPHINE SULFATE (PF) 4 MG/ML IV SOLN
4.0000 mg | Freq: Once | INTRAVENOUS | Status: AC
  Administered 2022-03-09: 4 mg via INTRAVENOUS
  Filled 2022-03-09: qty 1

## 2022-03-09 MED ORDER — IOHEXOL 300 MG/ML  SOLN
75.0000 mL | Freq: Once | INTRAMUSCULAR | Status: AC | PRN
  Administered 2022-03-09: 75 mL via INTRAVENOUS

## 2022-03-09 MED ORDER — SODIUM CHLORIDE 0.9 % IV BOLUS
1000.0000 mL | Freq: Once | INTRAVENOUS | Status: AC
  Administered 2022-03-09: 1000 mL via INTRAVENOUS

## 2022-03-09 NOTE — ED Notes (Signed)
Pt to ED for abdominal pain and periumbilical hernia which was already reduced by EDP. NAD at  this time but continues to have pain 5/10.

## 2022-03-09 NOTE — ED Triage Notes (Signed)
Pt here from [facility] with abd pain and a possible hernia. Pt states his belly button "popped out" on its own. Pt denies N/V/D.

## 2022-03-09 NOTE — ED Provider Notes (Signed)
[facility] Provider Note    Event Date/Time   First MD Initiated Contact with Patient 03/09/22 1141     (approximate)  History   Chief Complaint: Abdominal Pain and Hernia  HPI  Jesse Montgomery is a 51 y.o. male who presents to the emergency department for a mass and pain around his bellybutton.  According to the patient around 24 hours ago he states he felt his bellybutton pushed out which has become painful over the past 24 hours.  Patient states last bowel movement was yesterday.  No nausea or vomiting.  No history of hernia previously.  Physical Exam   Triage Vital Signs: ED Triage Vitals  Enc Vitals Group     BP 03/09/22 1112 (!) 139/97     Pulse Rate 03/09/22 1112 69     Resp 03/09/22 1112 18     Temp 03/09/22 1112 98.5 F (36.9 C)     Temp Source 03/09/22 1112 Oral     SpO2 03/09/22 1112 96 %     Weight 03/09/22 1113 218 lb (98.9 kg)     Height 03/09/22 1113 '5\' 9"'$  (1.753 m)     Head Circumference --      Peak Flow --      Pain Score 03/09/22 1113 7     Pain Loc --      Pain Edu? --      Excl. in [place]? --     Most recent vital signs: Vitals:   03/09/22 1112  BP: (!) 139/97  Pulse: 69  Resp: 18  Temp: 98.5 F (36.9 C)  SpO2: 96%    General: Awake, no distress.  CV:  Good peripheral perfusion.  Regular rate and rhythm  Resp:  Normal effort.  Equal breath sounds bilaterally.  Abd:  No distention.  Soft, mass palpated just inferior to umbilicus consistent with likely umbilical hernia.  Moderate tenderness to palpation no skin color changes.    ED Results / Procedures / Treatments   RADIOLOGY  I have reviewed the CT images patient does appear to have a small umbilical hernia remaining on my interpretation.  IMPRESSION:  1. Small umbilical hernia containing fat, enlarged from the prior  abdominal CT. The fat in this hernia appears edematous suggesting  possible incarceration. No herniated bowel. Correlate clinically.  2. Small  low-density renal lesions are likely cysts, although are  incompletely characterized based on size. Correlate with surgical  history and reason for previous partial right nephrectomy; in the  absence of clinically indicated signs/symptoms the current findings  require no independent follow-up.    MEDICATIONS ORDERED IN ED: Medications  morphine (PF) 4 MG/ML injection 4 mg (has no administration in time range)  ondansetron (ZOFRAN) injection 4 mg (has no administration in time range)  sodium chloride 0.9 % bolus 1,000 mL (has no administration in time range)     IMPRESSION / MDM / ASSESSMENT AND PLAN / ED COURSE  I reviewed the triage vital signs and the nursing notes.  Patient's presentation is most consistent with acute complicated illness / injury requiring diagnostic workup.  Patient presents to the emergency department for abdominal pain around the umbilicus with what appears to be a palpable umbilical hernia.  No history of hernias previously.  With sustained pressure I was able to reduce the hernia at the bedside.  Patient continues to have abdominal pain however we will obtain CT images to rule out any remaining hernia or bowel injury.  Reassuringly patient's  labs are normal including a normal CBC with a normal white blood cell count, normal chemistry and a negative lipase.  We will treat pain nausea and IV hydrate while awaiting CT imaging.  Patient agreeable to plan of care.  Patient is feeling much better.  Patient's CT scan shows umbilical hernia with some fat.  The hernia appears edematous suggesting possible incarceration although I believe this is probably likely prior to my reduction as the umbilicus is now soft.  I discussed the renal lesions with PCP follow-up.  Patient agreeable.  FINAL CLINICAL IMPRESSION(S) / ED DIAGNOSES   Umbilical hernia  Rx / DC Orders   Surgery follow-up  Note:  This document was prepared using Dragon voice recognition software and may include  unintentional dictation errors.   Harvest Dark, MD 03/09/22 [phone]

## 2022-03-22 ENCOUNTER — Ambulatory Visit: Payer: Managed Care, Other (non HMO) | Admitting: Surgery

## 2022-03-29 ENCOUNTER — Ambulatory Visit: Payer: Managed Care, Other (non HMO) | Admitting: Surgery

## 2022-03-29 ENCOUNTER — Encounter: Payer: Self-pay | Admitting: Surgery

## 2022-03-29 ENCOUNTER — Other Ambulatory Visit: Payer: Self-pay

## 2022-03-29 VITALS — BP 123/83 | HR 84 | Temp 98.2°F | Ht 69.0 in | Wt 222.4 lb

## 2022-03-29 DIAGNOSIS — K420 Umbilical hernia with obstruction, without gangrene: Secondary | ICD-10-CM

## 2022-03-29 DIAGNOSIS — K429 Umbilical hernia without obstruction or gangrene: Secondary | ICD-10-CM

## 2022-03-29 NOTE — Patient Instructions (Signed)
Our surgery scheduler Pamala Hurry will call you within 24-48 hours to get you scheduled. If you have not heard from her after 48 hours, please call our office. Have the blue sheet available when she calls to write down important information.  If you have any concerns or questions, please feel free to call our office. See follow up appointment below.   Umbilical Hernia, Adult  A hernia is a bulge of tissue that pushes through an opening between muscles. An umbilical hernia happens in the abdomen, near the belly button (umbilicus). The hernia may contain tissues from the small intestine, large intestine, or fatty tissue covering the intestines. Umbilical hernias in adults tend to get worse over time, and they require surgical treatment. There are different types of umbilical hernias, including: Indirect hernia. This type is located just above or below the umbilicus. It is the most common type of umbilical hernia in adults. Direct hernia. This type forms through an opening formed by the umbilicus. Reducible hernia. This type of hernia comes and goes. It may be visible only when you strain, lift something heavy, or cough. This type of hernia can be pushed back into the abdomen (reduced). Incarcerated hernia. This type traps abdominal tissue inside the hernia. This type of hernia cannot be reduced. Strangulated hernia. This type of hernia cuts off blood flow to the tissues inside the hernia. The tissues can start to die if this happens. This type of hernia requires emergency treatment. What are the causes? An umbilical hernia happens when tissue inside the abdomen presses on a weak area of the abdominal muscles. What increases the risk? You may have a greater risk of this condition if you: Are obese. Have had several pregnancies. Have a buildup of fluid inside your abdomen. Have had surgery that weakens the abdominal muscles. What are the signs or symptoms? The main symptom of this condition is a  painless bulge at or near the belly button. A reducible hernia may be visible only when you strain, lift something heavy, or cough. Other symptoms may include: Dull pain. A feeling of pressure. Symptoms of a strangulated hernia may include: Pain that gets increasingly worse. Nausea and vomiting. Pain when pressing on the hernia. Skin over the hernia becoming red or purple. Constipation. Blood in the stool. How is this diagnosed? This condition may be diagnosed based on: A physical exam. You may be asked to cough or strain while standing. These actions increase the pressure inside your abdomen and can force the hernia through the opening in your muscles. Your health care provider may try to reduce the hernia by pressing on it. Your symptoms and medical history. How is this treated? Surgery is the only treatment for an umbilical hernia. Surgery for a strangulated hernia is done as soon as possible. If you have a small hernia that is not incarcerated, you may need to lose weight before having surgery. Follow these instructions at home: Lose weight, if told by your health care provider. Do not try to push the hernia back in. Watch your hernia for any changes in color or size. Tell your health care provider if any changes occur. You may need to avoid activities that increase pressure on your hernia. Do not lift anything that is heavier than 10 lb (4.5 kg), or the limit that you are told, until your health care provider says that it is safe. Take over-the-counter and prescription medicines only as told by your health care provider. Keep all follow-up visits. This is important.  Contact a health care provider if: Your hernia gets larger. Your hernia becomes painful. Get help right away if: You develop sudden, severe pain near the area of your hernia. You have pain as well as nausea or vomiting. You have pain and the skin over your hernia changes color. You develop a fever or  chills. Summary A hernia is a bulge of tissue that pushes through an opening between muscles. An umbilical hernia happens near the belly button. Surgery is the only treatment for an umbilical hernia. Do not try to push your hernia back in. Keep all follow-up visits. This is important. This information is not intended to replace advice given to you by your health care provider. Make sure you discuss any questions you have with your health care provider. Document Revised: 05/05/2020 Document Reviewed: 05/05/2020 Elsevier Patient Education  [place].

## 2022-03-30 ENCOUNTER — Telehealth: Payer: Self-pay

## 2022-03-30 NOTE — Telephone Encounter (Signed)
Message left for the patient to call back to get his surgery information.  Surgery Date: 05/27/2022 Preadmission Testing Date: 05/19/2022 (phone 1pm-5pm) Patient to call [phone], between 1-3:00pm the day before surgery(05/26/2022), to find out what time to arrive for surgery.

## 2022-04-02 NOTE — Progress Notes (Signed)
Patient ID: Jesse Montgomery, male   DOB: 28-Jan-1971, 51 y.o.   MRN: [id number]  HPI Jesse Montgomery is a 51 y.o. male seen in consultation at the request of Dr. Lenard Lance for umbilical hernia.  2 weeks ago he went to the emergency room because he present with a cute onset of pain around his bellybutton. Pain at that time was moderate intensity and worsening with Valsalva.  He did get some medicines in the ER with significant relief.  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 Of note he did have a renal cell carcinoma that was removed robotically a few years ago.  Please note on the CT scan there is no evidence of recurrence.  CBC and CMP is normal except mildly elevated glucose to 170. He is able to perform more than 4 METS of activity without any shortness of breath or chest pain.  He is accompanied by his wife  HPI  Past Medical History:  Diagnosis Date   Cancer [facility])     Past Surgical History:  Procedure Laterality Date   ANTERIOR CRUCIATE LIGAMENT REPAIR     FLEXIBLE BRONCHOSCOPY N/A 09/09/2015   Procedure: FLEXIBLE BRONCHOSCOPY;  Surgeon: Yevonne Pax, MD;  Location: [facility] ORS;  Service: Pulmonary;  Laterality: N/A;   KNEE SURGERY Left    PARTIAL NEPHRECTOMY      Family History  Problem Relation Age of Onset   Cancer Father     Social History Social History   Tobacco Use   Smoking status: Never   Smokeless tobacco: Never  Substance Use Topics   Alcohol use: Yes    Alcohol/week: 8.0 standard drinks of alcohol    Types: 8 Cans of beer per week    Comment: occ   Drug use: No    No Known Allergies  No current outpatient medications on file.   No current facility-administered medications for this visit.     Review of Systems Full ROS  was asked and was negative except for the information on the HPI  Physical Exam Blood pressure 123/83, pulse 84,  temperature 98.2 F (36.8 C), temperature source Oral, height 5\' 9"  (1.753 m), weight 222 lb 6.4 oz (100.9 kg), SpO2 96 %. CONSTITUTIONAL: NAD. EYES: Pupils are equal, round,Sclera are non-icteric. EARS, NOSE, MOUTH AND THROAT: . The oral mucosa is pink and moist. Hearing is intact to voice. LYMPH NODES:  Lymph nodes in the neck are normal. RESPIRATORY:  Lungs are clear. There is normal respiratory effort, with equal breath sounds bilaterally, and without pathologic use of accessory muscles. CARDIOVASCULAR: Heart is regular without murmurs, gallops, or rubs. GI: The abdomen is  soft, 3 cms partially incarcerated fat umbilical hernia  nontender, and nondistended. There are no palpable masses. There is no hepatosplenomegaly. There are normal bowel sounds  GU: Rectal deferred.   MUSCULOSKELETAL: Normal muscle strength and tone. No cyanosis or edema.   SKIN: Turgor is good and there are no pathologic skin lesions or ulcers. NEUROLOGIC: Motor and sensation is grossly normal. Cranial nerves are grossly intact. PSYCH:  Oriented to person, place and time. Affect is normal.  Data Reviewed  I have personally reviewed the patient's imaging, laboratory findings and medical records.    Assessment/Plan 51 year old male with symptomatic umbilical hernia.  Discussed with patient in detail about his disease process.  I do recommend repair.  This is certainly not urgent.  Procedure discussed with the patient in detail.  Risks, benefits and possible implications including but not limited to: Bleeding, infection mesh issues, recurrences and chronic pain.  Do think we can probably do this open with mesh placement.  Currently patient wishes to wait for about a month or 2.  We will see him back in consult patient in the office next month to schedule his elective surgery. I spent 60 minutes in this encounter including coordination of her care, personally reviewing imaging studies, placing orders and performing  appropriate documentation A copy of this report was sent to the referring provider  Sterling Big, MD FACS General Surgeon 04/02/2022, 2:04 PM

## 2022-05-11 DIAGNOSIS — K429 Umbilical hernia without obstruction or gangrene: Secondary | ICD-10-CM

## 2022-05-11 HISTORY — DX: Umbilical hernia without obstruction or gangrene: K42.9

## 2022-05-12 ENCOUNTER — Ambulatory Visit: Payer: Managed Care, Other (non HMO) | Admitting: Surgery

## 2022-05-19 ENCOUNTER — Encounter
Admission: RE | Admit: 2022-05-19 | Discharge: 2022-05-19 | Disposition: A | Discharge status: Home / Self Care | Payer: Managed Care, Other (non HMO) | Source: Ambulatory Visit | Attending: Surgery | Admitting: Surgery

## 2022-05-19 HISTORY — DX: Malignant neoplasm of right kidney, except renal pelvis: C64.1

## 2022-05-19 NOTE — Addendum Note (Signed)
Addended by: Caroleen Hamman F on: 05/19/2022 02:10 PM   Modules accepted: Orders

## 2022-05-19 NOTE — Patient Instructions (Addendum)
Your procedure is scheduled on: Thursday, August 17 Report to the Registration Desk on the 1st floor of the [facility]. To find out your arrival time, please call [phone] between 1PM - 3PM on: Wednesday, August 16 If your arrival time is 6:00 am, do not arrive prior to that time as the [place] entrance doors do not open until 6:00 am.  REMEMBER: Instructions that are not followed completely may result in serious medical risk, up to and including death; or upon the discretion of your surgeon and anesthesiologist your surgery may need to be rescheduled.  Do not eat food after midnight the night before surgery.  No gum chewing, lozengers or hard candies.  You may however, drink CLEAR liquids up to 2 hours before you are scheduled to arrive for your surgery. Do not drink anything within 2 hours of your scheduled arrival time.  Clear liquids include: - water  - apple juice without pulp - gatorade (not RED colors) - black coffee or tea (Do NOT add milk or creamers to the coffee or tea) Do NOT drink anything that is not on this list.  DO NOT TAKE ANY MEDICATIONS THE MORNING OF SURGERY   One week prior to surgery: starting August 10 Stop Anti-inflammatories (NSAIDS) such as Advil, Aleve, Ibuprofen, Motrin, Naproxen, Naprosyn and Aspirin based products such as Excedrin, Goodys Powder, BC Powder. Stop ANY OVER THE COUNTER supplements until after surgery. You may however, continue to take Tylenol if needed for pain up until the day of surgery.  No Alcohol for 24 hours before or after surgery.  No Smoking including e-cigarettes for 24 hours prior to surgery.  No chewable tobacco products for at least 6 hours prior to surgery.  No nicotine patches on the day of surgery.  Do not use any "recreational" drugs for at least a week prior to your surgery.  Please be advised that the combination of cocaine and anesthesia may have negative outcomes, up to and including death. If you test  positive for cocaine, your surgery will be cancelled.  On the morning of surgery brush your teeth with toothpaste and water, you may rinse your mouth with mouthwash if you wish. Do not swallow any toothpaste or mouthwash.  Use CHG Soap as directed on instruction sheet.  Do not wear jewelry, make-up, hairpins, clips or nail polish.  Do not wear lotions, powders, or perfumes.   Do not shave body from the neck down 48 hours prior to surgery just in case you cut yourself which could leave a site for infection.  Also, freshly shaved skin may become irritated if using the CHG soap.  Contact lenses, hearing aids and dentures may not be worn into surgery.  Do not bring valuables to the hospital. [facility] is not responsible for any missing/lost belongings or valuables.   Notify your doctor if there is any change in your medical condition (cold, fever, infection).  Wear comfortable clothing (specific to your surgery type) to the hospital.  After surgery, you can help prevent lung complications by doing breathing exercises.  Take deep breaths and cough every 1-2 hours. Your doctor may order a device called an Incentive Spirometer to help you take deep breaths. When coughing or sneezing, hold a pillow firmly against your incision with both hands. This is called "splinting." Doing this helps protect your incision. It also decreases belly discomfort.  If you are being discharged the day of surgery, you will not be allowed to drive home. You will  need a responsible adult (18 years or older) to drive you home and stay with you that night.   If you are taking public transportation, you will need to have a responsible adult (18 years or older) with you. Please confirm with your physician that it is acceptable to use public transportation.   Please call the [place] [facility] at [phone] if you have any questions about these instructions.  Surgery Visitation Policy:  Patients  undergoing a surgery or procedure may have two family members or support persons with them as long as the person is not COVID-19 positive or experiencing its symptoms.   Preparing for Surgery with [place] (CHG) Soap    Before surgery, you can play an important role by reducing the number of germs on your skin.  CHG (Chlorhexidine gluconate) soap is an antiseptic cleanser which kills germs and bonds with the skin to continue killing germs even after washing.  Please do not use if you have an allergy to CHG or antibacterial soaps. If your skin becomes reddened/irritated stop using the CHG.  1. Shower the NIGHT BEFORE SURGERY and the MORNING OF SURGERY with CHG soap.  2. If you choose to wash your hair, wash your hair first as usual with your normal shampoo.  3. After shampooing, rinse your hair and body thoroughly to remove the shampoo.  4. Use CHG as you would any other liquid soap. You can apply CHG directly to the skin and wash gently with a scrungie or a clean washcloth.  5. Apply the CHG soap to your body only from the neck down. Do not use on open wounds or open sores. Avoid contact with your eyes, ears, mouth, and genitals (private parts). Wash face and genitals (private parts) with your normal soap.  6. Wash thoroughly, paying special attention to the area where your surgery will be performed.  7. Thoroughly rinse your body with warm water.  8. Do not shower/wash with your normal soap after using and rinsing off the CHG soap.  9. Pat yourself dry with a clean towel.  10. Wear clean pajamas to bed the night before surgery.  12. Place clean sheets on your bed the night of your first shower and do not sleep with pets.  13. Shower again with the CHG soap on the day of surgery prior to arriving at the hospital.  14. Do not apply any deodorants/lotions/powders.  15. Please wear clean clothes to the hospital.

## 2022-05-26 MED ORDER — ORAL CARE MOUTH RINSE: 15.0000 mL | Freq: Once | OROMUCOSAL | Status: AC

## 2022-05-26 MED ORDER — CHLORHEXIDINE GLUCONATE CLOTH 2 % EX PADS: 6.0000 | MEDICATED_PAD | Freq: Once | CUTANEOUS | Status: DC

## 2022-05-26 MED ORDER — CEFAZOLIN SODIUM-DEXTROSE 2-4 GM/100ML-% IV SOLN
2.0000 g | INTRAVENOUS | Status: AC
  Administered 2022-05-27: 2 g via INTRAVENOUS

## 2022-05-26 MED ORDER — FAMOTIDINE 20 MG PO TABS: 20.0000 mg | ORAL_TABLET | Freq: Once | ORAL | Status: AC

## 2022-05-26 MED ORDER — ACETAMINOPHEN 500 MG PO TABS: 1000.0000 mg | ORAL_TABLET | ORAL | Status: AC

## 2022-05-26 MED ORDER — CHLORHEXIDINE GLUCONATE 0.12 % MT SOLN: 15.0000 mL | Freq: Once | OROMUCOSAL | Status: AC

## 2022-05-26 MED ORDER — BUPIVACAINE LIPOSOME 1.3 % IJ SUSP: 20.0000 mL | Freq: Once | INTRAMUSCULAR | Status: DC

## 2022-05-26 MED ORDER — GABAPENTIN 300 MG PO CAPS: 300.0000 mg | ORAL_CAPSULE | ORAL | Status: AC

## 2022-05-26 MED ORDER — LACTATED RINGERS IV SOLN: INTRAVENOUS | Status: DC

## 2022-05-27 ENCOUNTER — Other Ambulatory Visit: Payer: Self-pay

## 2022-05-27 ENCOUNTER — Ambulatory Visit: Payer: 59 | Admitting: Certified Registered"

## 2022-05-27 ENCOUNTER — Ambulatory Visit
Admission: RE | Admit: 2022-05-27 | Discharge: 2022-05-27 | Disposition: A | Discharge status: Home / Self Care | Payer: 59 | Attending: Surgery | Admitting: Surgery

## 2022-05-27 ENCOUNTER — Encounter
Admission: RE | Disposition: A | Discharge status: Home / Self Care | Payer: Self-pay | Source: Home / Self Care | Attending: Surgery

## 2022-05-27 ENCOUNTER — Encounter: Payer: Self-pay | Admitting: Surgery

## 2022-05-27 DIAGNOSIS — K420 Umbilical hernia with obstruction, without gangrene: Secondary | ICD-10-CM | POA: Diagnosis not present

## 2022-05-27 DIAGNOSIS — K429 Umbilical hernia without obstruction or gangrene: Secondary | ICD-10-CM

## 2022-05-27 HISTORY — PX: UMBILICAL HERNIA REPAIR: SHX196

## 2022-05-27 SURGERY — REPAIR, HERNIA, UMBILICAL, ADULT
Anesthesia: General

## 2022-05-27 MED ORDER — 0.9 % SODIUM CHLORIDE (POUR BTL) OPTIME
TOPICAL | Status: DC | PRN
  Administered 2022-05-27: 500 mL

## 2022-05-27 MED ORDER — CHLORHEXIDINE GLUCONATE 0.12 % MT SOLN
OROMUCOSAL | Status: AC
  Administered 2022-05-27: 15 mL via OROMUCOSAL
  Filled 2022-05-27: qty 15

## 2022-05-27 MED ORDER — PROPOFOL 10 MG/ML IV BOLUS
INTRAVENOUS | Status: AC
  Filled 2022-05-27: qty 20

## 2022-05-27 MED ORDER — HYDROCODONE-ACETAMINOPHEN 5-325 MG PO TABS: 1.0000 | ORAL_TABLET | ORAL | 0 refills | Status: DC | PRN

## 2022-05-27 MED ORDER — GABAPENTIN 300 MG PO CAPS
ORAL_CAPSULE | ORAL | Status: AC
  Administered 2022-05-27: 300 mg via ORAL
  Filled 2022-05-27: qty 1

## 2022-05-27 MED ORDER — ACETAMINOPHEN 500 MG PO TABS
ORAL_TABLET | ORAL | Status: AC
  Administered 2022-05-27: 1000 mg via ORAL
  Filled 2022-05-27: qty 2

## 2022-05-27 MED ORDER — MIDAZOLAM HCL 2 MG/2ML IJ SOLN
INTRAMUSCULAR | Status: AC
Start: 2022-05-27 — End: ?
  Filled 2022-05-27: qty 2

## 2022-05-27 MED ORDER — CEFAZOLIN SODIUM-DEXTROSE 2-4 GM/100ML-% IV SOLN
INTRAVENOUS | Status: AC
  Filled 2022-05-27: qty 100

## 2022-05-27 MED ORDER — BUPIVACAINE LIPOSOME 1.3 % IJ SUSP
INTRAMUSCULAR | Status: AC
Start: 2022-05-27 — End: ?
  Filled 2022-05-27: qty 20

## 2022-05-27 MED ORDER — MIDAZOLAM HCL 2 MG/2ML IJ SOLN
INTRAMUSCULAR | Status: DC | PRN
  Administered 2022-05-27: 2 mg via INTRAVENOUS

## 2022-05-27 MED ORDER — PROPOFOL 10 MG/ML IV BOLUS
INTRAVENOUS | Status: DC | PRN
  Administered 2022-05-27: 150 mg via INTRAVENOUS
  Administered 2022-05-27: 50 mg via INTRAVENOUS

## 2022-05-27 MED ORDER — SUGAMMADEX SODIUM 200 MG/2ML IV SOLN
INTRAVENOUS | Status: DC | PRN
  Administered 2022-05-27: 200 mg via INTRAVENOUS

## 2022-05-27 MED ORDER — BUPIVACAINE-EPINEPHRINE (PF) 0.5% -1:200000 IJ SOLN
INTRAMUSCULAR | Status: AC
  Filled 2022-05-27: qty 30

## 2022-05-27 MED ORDER — FAMOTIDINE 20 MG PO TABS
ORAL_TABLET | ORAL | Status: AC
  Administered 2022-05-27: 20 mg via ORAL
  Filled 2022-05-27: qty 1

## 2022-05-27 MED ORDER — FENTANYL CITRATE (PF) 100 MCG/2ML IJ SOLN
INTRAMUSCULAR | Status: AC
  Filled 2022-05-27: qty 2

## 2022-05-27 MED ORDER — DEXAMETHASONE SODIUM PHOSPHATE 10 MG/ML IJ SOLN
INTRAMUSCULAR | Status: DC | PRN
  Administered 2022-05-27: 10 mg via INTRAVENOUS

## 2022-05-27 MED ORDER — ONDANSETRON HCL 4 MG/2ML IJ SOLN
INTRAMUSCULAR | Status: DC | PRN
  Administered 2022-05-27: 4 mg via INTRAVENOUS

## 2022-05-27 MED ORDER — LIDOCAINE HCL (CARDIAC) PF 100 MG/5ML IV SOSY
PREFILLED_SYRINGE | INTRAVENOUS | Status: DC | PRN
  Administered 2022-05-27: 50 mg via INTRAVENOUS

## 2022-05-27 MED ORDER — BUPIVACAINE-EPINEPHRINE (PF) 0.5% -1:200000 IJ SOLN
INTRAMUSCULAR | Status: DC | PRN
  Administered 2022-05-27: 50 mL via INTRAMUSCULAR

## 2022-05-27 MED ORDER — FENTANYL CITRATE (PF) 100 MCG/2ML IJ SOLN
INTRAMUSCULAR | Status: DC | PRN
  Administered 2022-05-27: 100 ug via INTRAVENOUS

## 2022-05-27 MED ORDER — ROCURONIUM BROMIDE 100 MG/10ML IV SOLN
INTRAVENOUS | Status: DC | PRN
  Administered 2022-05-27: 50 mg via INTRAVENOUS

## 2022-05-27 MED ORDER — DEXMEDETOMIDINE (PRECEDEX) IN NS 20 MCG/5ML (4 MCG/ML) IV SYRINGE
PREFILLED_SYRINGE | INTRAVENOUS | Status: DC | PRN
  Administered 2022-05-27: 4 ug via INTRAVENOUS
  Administered 2022-05-27 (×2): 8 ug via INTRAVENOUS

## 2022-05-27 SURGICAL SUPPLY — 30 items
APPLIER CLIP 11 MED OPEN (CLIP) ×1
BLADE CLIPPER SURG (BLADE) ×1 IMPLANT
BLADE SURG 15 STRL LF DISP TIS (BLADE) ×1 IMPLANT
BLADE SURG 15 STRL SS (BLADE) ×1
CHLORAPREP W/TINT 26 (MISCELLANEOUS) ×1 IMPLANT
CLIP APPLIE 11 MED OPEN (CLIP) ×1 IMPLANT
DRAPE INCISE IOBAN 66X45 STRL (DRAPES) ×1 IMPLANT
DRAPE LAPAROTOMY 77X122 PED (DRAPES) ×1 IMPLANT
DRSG TELFA 3X8 NADH (GAUZE/BANDAGES/DRESSINGS) ×1 IMPLANT
ELECT CAUTERY BLADE 6.4 (BLADE) ×1 IMPLANT
ELECT REM PT RETURN 9FT ADLT (ELECTROSURGICAL) ×1
ELECTRODE REM PT RTRN 9FT ADLT (ELECTROSURGICAL) ×1 IMPLANT
GAUZE 4X4 16PLY ~~LOC~~+RFID DBL (SPONGE) ×1 IMPLANT
GLOVE BIO SURGEON STRL SZ7 (GLOVE) ×1 IMPLANT
GOWN STRL REUS W/ TWL LRG LVL3 (GOWN DISPOSABLE) ×2 IMPLANT
GOWN STRL REUS W/TWL LRG LVL3 (GOWN DISPOSABLE) ×2
MANIFOLD NEPTUNE II (INSTRUMENTS) ×1 IMPLANT
MESH VENTRALEX ST 1-7/10 CRC S (Mesh General) ×1 IMPLANT
NEEDLE HYPO 22GX1.5 SAFETY (NEEDLE) ×1 IMPLANT
NS IRRIG 500ML POUR BTL (IV SOLUTION) ×1 IMPLANT
PACK BASIN MINOR ARMC (MISCELLANEOUS) ×1 IMPLANT
PAD DRESSING TELFA 3X8 NADH (GAUZE/BANDAGES/DRESSINGS) ×1 IMPLANT
SPONGE T-LAP 18X18 ~~LOC~~+RFID (SPONGE) ×1 IMPLANT
SUT ETHIBOND NAB MO 7 #0 18IN (SUTURE) ×1 IMPLANT
SUT MNCRL AB 4-0 PS2 18 (SUTURE) ×1 IMPLANT
SUT VIC AB 3-0 SH 27 (SUTURE) ×1
SUT VIC AB 3-0 SH 27X BRD (SUTURE) ×1 IMPLANT
SYR 20ML LL LF (SYRINGE) ×1 IMPLANT
TRAP FLUID SMOKE EVACUATOR (MISCELLANEOUS) ×1 IMPLANT
WATER STERILE IRR 500ML POUR (IV SOLUTION) ×1 IMPLANT

## 2022-05-27 NOTE — Anesthesia Postprocedure Evaluation (Signed)
Anesthesia Post Note  Patient: Jesse Montgomery  Procedure(s) Performed: HERNIA REPAIR UMBILICAL ADULT  Patient location during evaluation: PACU Anesthesia Type: General Level of consciousness: awake and alert Pain management: pain level controlled Vital Signs Assessment: post-procedure vital signs reviewed and stable Respiratory status: spontaneous breathing, nonlabored ventilation, respiratory function stable and patient connected to nasal cannula oxygen Cardiovascular status: blood pressure returned to baseline and stable Postop Assessment: no apparent nausea or vomiting Anesthetic complications: no   No notable events documented.   Last Vitals:  Vitals:   05/27/22 1315 05/27/22 1333  BP: 116/89 128/85  Pulse: 80 87  Resp: 12 15  Temp: (!) 36.4 C (!) 36.2 C  SpO2: 94% 96%    Last Pain:  Vitals:   05/27/22 1333  TempSrc: Temporal  PainSc: 0-No pain                 Precious Haws Valin Massie

## 2022-05-27 NOTE — Anesthesia Preprocedure Evaluation (Signed)
Anesthesia Evaluation  Patient identified by MRN, date of birth, ID band Patient awake    Reviewed: Allergy & Precautions, NPO status , Patient's Chart, lab work & pertinent test results  History of Anesthesia Complications Negative for: history of anesthetic complications  Airway Mallampati: III  TM Distance: >3 FB Neck ROM: full    Dental  (+) Chipped, Poor Dentition   Pulmonary neg pulmonary ROS, neg shortness of breath,    Pulmonary exam normal        Cardiovascular Exercise Tolerance: Good (-) angina(-) Past MI and (-) DOE negative cardio ROS Normal cardiovascular exam     Neuro/Psych negative neurological ROS  negative psych ROS   GI/Hepatic negative GI ROS, Neg liver ROS, neg GERD  ,  Endo/Other  negative endocrine ROS  Renal/GU Renal disease     Musculoskeletal   Abdominal   Peds  Hematology negative hematology ROS (+)   Anesthesia Other Findings Past Medical History: No date: Renal carcinoma, right ([place]) 13/1438: Umbilical hernia  Past Surgical History: No date: ANTERIOR CRUCIATE LIGAMENT REPAIR; Left     Comment:  x3 09/09/2015: FLEXIBLE BRONCHOSCOPY; N/A     Comment:  Procedure: FLEXIBLE BRONCHOSCOPY;  Surgeon: Allyne Gee, MD;  Location: [facility] ORS;  Service: Pulmonary;                Laterality: N/A; No date: MENISCUS REPAIR; Left     Comment:  x2 2011: PARTIAL NEPHRECTOMY; Right 09/2020: TOTAL KNEE ARTHROPLASTY; Left No date: VASECTOMY  BMI    Body Mass Index: 33.23 kg/m      Reproductive/Obstetrics negative OB ROS                             Anesthesia Physical Anesthesia Plan  ASA: 2  Anesthesia Plan: General ETT   Post-op Pain Management:    Induction: Intravenous  PONV Risk Score and Plan: Ondansetron, Dexamethasone, Midazolam and Treatment may vary due to age or medical condition  Airway Management Planned: Oral ETT  Additional  Equipment:   Intra-op Plan:   Post-operative Plan: Extubation in OR  Informed Consent: I have reviewed the patients History and Physical, chart, labs and discussed the procedure including the risks, benefits and alternatives for the proposed anesthesia with the patient or authorized representative who has indicated his/her understanding and acceptance.     Dental Advisory Given  Plan Discussed with: Anesthesiologist, CRNA and Surgeon  Anesthesia Plan Comments: (Patient consented for risks of anesthesia including but not limited to:  - adverse reactions to medications - damage to eyes, teeth, lips or other oral mucosa - nerve damage due to positioning  - sore throat or hoarseness - Damage to heart, brain, nerves, lungs, other parts of body or loss of life  Patient voiced understanding.)        Anesthesia Quick Evaluation

## 2022-05-27 NOTE — Op Note (Signed)
Umbilical Hernia Repair with Mesh 4.3 ventralex patch  Pre-operative Diagnosis: umbilical hernia  Post-operative Diagnosis: same  Surgeon: Caroleen Hamman, MD FACS  Anesthesia: Gen. with endotracheal tube   Findings: 1.5 cm UH chronically incarcerated  Estimated Blood Loss: 5cc                Specimens: sac          Complications: none              Procedure Details  The patient was seen again in the Holding Room. The benefits, complications, treatment options, and expected outcomes were discussed with the patient. The risks of bleeding, infection, recurrence of symptoms, failure to resolve symptoms, bowel injury, mesh placement, mesh infection, any of which could require further surgery were reviewed with the patient. The likelihood of improving the patient's symptoms with return to their baseline status is good.  The patient and/or family concurred with the proposed plan, giving informed consent.  The patient was taken to Operating Room, identified and the procedure verified.  A Time Out was held and the above information confirmed.  Prior to the induction of general anesthesia, antibiotic prophylaxis was administered. VTE prophylaxis was in place. General endotracheal anesthesia was then administered and tolerated well. After the induction, the abdomen was prepped with Chloraprep and draped in the sterile fashion. The patient was positioned in the supine position.  Incision was created with a scalpel over the hernia defect. Electrocautery was used to dissect through subcutaneous tissue, the hernia sac was opened excised.  The hernia was measured, 1.5cms. A BARD 4.3cm Ventralex small patch selected and secure to the fascia. I closed the hernia defect with interrupted 0 Ethibond sutures.   Incision was closed in a 2 layer fashion with 3-0 Vicryl and 4-0 Monocryl. Dermabond was used to coat the skin. Liposomal Marcaine  was used to inject the incision site. Patient tolerated procedure  well and there were no immediate complications. Needle and laparotomy counts were correct

## 2022-05-27 NOTE — Anesthesia Procedure Notes (Signed)
Procedure Name: Intubation Date/Time: 05/27/2022 12:02 PM  Performed by: Biagio Borg, CRNAPre-anesthesia Checklist: Patient identified, Emergency Drugs available, Suction available and Patient being monitored Patient Re-evaluated:Patient Re-evaluated prior to induction Oxygen Delivery Method: Circle system utilized Preoxygenation: Pre-oxygenation with 100% oxygen Induction Type: IV induction Ventilation: Mask ventilation without difficulty Laryngoscope Size: McGraph and 4 Grade View: Grade II Tube type: Oral Tube size: 7.0 mm Number of attempts: 1 Airway Equipment and Method: Stylet Placement Confirmation: ETT inserted through vocal cords under direct vision, positive ETCO2 and breath sounds checked- equal and bilateral Secured at: 21 cm Tube secured with: Tape Dental Injury: Teeth and Oropharynx as per pre-operative assessment

## 2022-05-27 NOTE — H&P (Signed)
HPI Jesse Montgomery is a 51 y.o. male seen in consultation at the request of Dr. Kerman Passey for umbilical hernia.  2 weeks ago he went to the emergency room because he present with a cute onset of pain around his bellybutton. Pain at that time was moderate intensity and worsening with Valsalva.  He did get some medicines in the ER with significant relief.  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 Of note he did have a renal cell carcinoma that was removed robotically a few years ago.  Please note on the CT scan there is no evidence of recurrence.  CBC and CMP is normal except mildly elevated glucose to 170. He is able to perform more than 4 METS of activity without any shortness of breath or chest pain.   Comes for elective repair today  HPI       Past Medical History:  Diagnosis Date   Cancer [facility])             Past Surgical History:  Procedure Laterality Date   ANTERIOR CRUCIATE LIGAMENT REPAIR       FLEXIBLE BRONCHOSCOPY N/A 09/09/2015    Procedure: FLEXIBLE BRONCHOSCOPY;  Surgeon: Allyne Gee, MD;  Location: [facility] ORS;  Service: Pulmonary;  Laterality: N/A;   KNEE SURGERY Left     PARTIAL NEPHRECTOMY               Family History  Problem Relation Age of Onset   Cancer Father        Social History Social History         Tobacco Use   Smoking status: Never   Smokeless tobacco: Never  Substance Use Topics   Alcohol use: Yes      Alcohol/week: 8.0 standard drinks of alcohol      Types: 8 Cans of beer per week      Comment: occ   Drug use: No      No Known Allergies   No current outpatient medications on file.    No current facility-administered medications for this visit.        Review of Systems Full ROS  was asked and was negative except for the information on the HPI   Physical Exam CONSTITUTIONAL: NAD. EYES: Pupils are equal,  round,Sclera are non-icteric. EARS, NOSE, MOUTH AND THROAT: . The oral mucosa is pink and moist. Hearing is intact to voice. LYMPH NODES:  Lymph nodes in the neck are normal. RESPIRATORY:  Lungs are clear. There is normal respiratory effort, with equal breath sounds bilaterally, and without pathologic use of accessory muscles. CARDIOVASCULAR: Heart is regular without murmurs, gallops, or rubs. GI: The abdomen is  soft, 3 cms partially incarcerated fat umbilical hernia  nontender, and nondistended. There are no palpable masses. There is no hepatosplenomegaly. There are normal bowel sounds  GU: Rectal deferred.   MUSCULOSKELETAL: Normal muscle strength and tone. No cyanosis or edema.   SKIN: Turgor is good and there are no pathologic skin lesions or ulcers. NEUROLOGIC: Motor and sensation is grossly normal. Cranial nerves are grossly intact. PSYCH:  Oriented to person, place and time. Affect is normal.   Data Reviewed   I have personally reviewed the patient's imaging, laboratory findings and medical records.     Assessment/Plan 51 year old male with symptomatic umbilical hernia.  Discussed with patient in detail about his disease process.  I do recommend  repair.   Procedure discussed with the patient in detail.  Risks, benefits and possible implications including but not limited to: Bleeding, infection mesh issues, recurrences and chronic pain.  I Do think we can probably do this open with mesh placement.  Questions answered.   Caroleen Hamman, MD [facility] General Surgeon

## 2022-05-27 NOTE — Discharge Instructions (Addendum)
Open Hernia Repair, Adult, Care After What can I expect after the procedure? After the procedure, it is common to have: Mild discomfort. Slight bruising. Mild swelling. Pain in the belly (abdomen). A small amount of blood from the cut from surgery (incision). Follow these instructions at home: Your doctor may give you more specific instructions. If you have problems, call your doctor. Medicines Take over-the-counter and prescription medicines only as told by your doctor. If told, take steps to prevent problems with pooping (constipation). You may need to: Drink enough fluid to keep your pee (urine) pale yellow. Take medicines. You will be told what medicines to take. Eat foods that are high in fiber. These include beans, whole grains, and fresh fruits and vegetables. Limit foods that are high in fat and sugar. These include fried or sweet foods. Ask your doctor if you should avoid driving or using machines while you are taking your medicine. Incision care  Follow instructions from your doctor about how to take care of your incision. Make sure you: Wash your hands with soap and water for at least 20 seconds before and after you change your bandage (dressing). If you cannot use soap and water, use hand sanitizer. Change your bandage. Leave stitches or skin glue in place for at least 2 weeks. Leave tape strips alone unless you are told to take them off. You may trim the edges of the tape strips if they curl up. Check your incision every day for signs of infection. Check for: More redness, swelling, or pain. More fluid or blood. Warmth. Pus or a bad smell. Wear loose, soft clothing while your incision heals. Activity  Rest as told by your doctor. Do not lift anything that is heavier than 10 lb (4.5 kg), or the limit that you are told. Do not play contact sports until your doctor says that this is safe. If you were given a sedative during your procedure, do not drive or use machines  until your doctor says that it is safe. A sedative is a medicine that helps you relax. Return to your normal activities when your doctor says that it is safe. General instructions Do not take baths, swim, or use a hot tub. Ask your doctor about taking showers or sponge baths. Hold a pillow over your belly when you cough or sneeze. This helps with pain. Do not smoke or use any products that contain nicotine or tobacco. If you need help quitting, ask your doctor. Keep all follow-up visits. Contact a doctor if: You have any of these signs of infection in or around your incision: More redness, swelling, or pain. More fluid or blood. Warmth. Pus. A bad smell. You have a fever or chills. You have blood in your poop (stool). You have not pooped (had a bowel movement) in 2-3 days. Medicine does not help your pain. Get help right away if: You have chest pain, or you are short of breath. You feel faint or light-headed. You have very bad pain. You vomit and your pain is worse. You have pain, swelling, or redness in a leg. These symptoms may be an emergency. Get help right away. Call your local emergency services (911 in the [place] Do not wait to see if the symptoms will go away. Do not drive yourself to the hospital. Summary After this procedure, it is common to have mild discomfort, slight bruising, and mild swelling. Follow instructions from your doctor about how to take care of your cut from surgery (incision). Check every  day for signs of infection. Do not lift heavy objects or play contact sports until your doctor says it is safe. Return to your normal activities as told by your doctor. This information is not intended to replace advice given to you by your health care provider. Make sure you discuss any questions you have with your health care provider.  AMBULATORY SURGERY  DISCHARGE INSTRUCTIONS   The drugs that you were given will stay in your system until tomorrow so for the next  24 hours you should not:  Drive an automobile Make any legal decisions Drink any alcoholic beverage   You may resume regular meals tomorrow.  Today it is better to start with liquids and gradually work up to solid foods.  You may eat anything you prefer, but it is better to start with liquids, then soup and crackers, and gradually work up to solid foods.   Please notify your doctor immediately if you have any unusual bleeding, trouble breathing, redness and pain at the surgery site, drainage, fever, or pain not relieved by medication.    Additional Instructions:  Please contact your physician with any problems or Same Day Surgery at [phone], Monday through Friday 6 am to 4 pm, or [facility] number at [phone].

## 2022-05-27 NOTE — Transfer of Care (Signed)
Immediate Anesthesia Transfer of Care Note  Patient: Jesse Montgomery  Procedure(s) Performed: HERNIA REPAIR UMBILICAL ADULT  Patient Location: PACU  Anesthesia Type:General  Level of Consciousness: awake  Airway & Oxygen Therapy: Patient Spontanous Breathing  Post-op Assessment: Report given to RN and Post -op Vital signs reviewed and stable  Post vital signs: Reviewed and stable  Last Vitals:  Vitals Value Taken Time  BP 133/86 05/27/22 1245  Temp    Pulse 102 05/27/22 1247  Resp 23 05/27/22 1247  SpO2 91 % 05/27/22 1247  Vitals shown include unvalidated device data.  Last Pain:  Vitals:   05/27/22 1111  TempSrc: Temporal  PainSc: 0-No pain         Complications: No notable events documented.

## 2022-05-28 ENCOUNTER — Encounter: Payer: Self-pay | Admitting: Surgery

## 2022-05-31 LAB — SURGICAL PATHOLOGY

## 2022-06-07 ENCOUNTER — Ambulatory Visit (INDEPENDENT_AMBULATORY_CARE_PROVIDER_SITE_OTHER): Payer: 59 | Admitting: Surgery

## 2022-06-07 ENCOUNTER — Encounter: Payer: Self-pay | Admitting: Surgery

## 2022-06-07 VITALS — BP 131/85 | HR 82 | Temp 98.3°F | Ht 69.0 in | Wt 223.0 lb

## 2022-06-07 DIAGNOSIS — Z09 Encounter for follow-up examination after completed treatment for conditions other than malignant neoplasm: Secondary | ICD-10-CM

## 2022-06-07 DIAGNOSIS — K420 Umbilical hernia with obstruction, without gangrene: Secondary | ICD-10-CM | POA: Diagnosis not present

## 2022-06-07 NOTE — Patient Instructions (Signed)

## 2022-06-08 NOTE — Progress Notes (Signed)
Outpatient Surgical Follow Up  06/08/2022  Jesse Montgomery is an 51 y.o. male.   Chief Complaint  Patient presents with   Routine Post Op    HPI: Jesse Montgomery is a 51 year old male 12 days out from umbilical hernia repair with mesh.  Doing very well.  Only took pain medicine for the first day or so.  Currently is pain-free.  No fevers no chills, he is Tolerating regular diet and having bowel movements.  He is ambulating and voices no concerns today.  Past Medical History:  Diagnosis Date   Renal carcinoma, right [facility])    Umbilical hernia [id number]    Past Surgical History:  Procedure Laterality Date   ANTERIOR CRUCIATE LIGAMENT REPAIR Left    x3   FLEXIBLE BRONCHOSCOPY N/A 09/09/2015   Procedure: FLEXIBLE BRONCHOSCOPY;  Surgeon: Allyne Gee, MD;  Location: [facility] ORS;  Service: Pulmonary;  Laterality: N/A;   MENISCUS REPAIR Left    x2   PARTIAL NEPHRECTOMY Right 2011   TOTAL KNEE ARTHROPLASTY Left 34/1937   UMBILICAL HERNIA REPAIR N/A 05/27/2022   Procedure: HERNIA REPAIR UMBILICAL ADULT;  Surgeon: Jules Husbands, MD;  Location: [facility] ORS;  Service: General;  Laterality: N/A;  Provider requesting 60 minutes for procedure   VASECTOMY      Family History  Problem Relation Age of Onset   Cancer Father     Social History:  reports that he has never smoked. He has never been exposed to tobacco smoke. He has never used smokeless tobacco. He reports current alcohol use of about 8.0 standard drinks of alcohol per week. He reports that he does not use drugs.  Allergies:  Allergies  Allergen Reactions   Bee Venom Anaphylaxis    Medications reviewed.    ROS Full ROS performed and is otherwise negative other than what is stated in HPI  BP 131/85   Pulse 82   Temp 98.3 F (36.8 C)   Ht '5\' 9"'$  (1.753 m)   Wt 223 lb (101.2 kg)   SpO2 97%   BMI 32.93 kg/m   Physical Exam Vitals and nursing note reviewed. Exam conducted with a chaperone present.  Constitutional:      Appearance:  He is normal weight.  Eyes:     General:        Right eye: No discharge.        Left eye: No discharge.  Pulmonary:     Effort: Pulmonary effort is normal. No respiratory distress.     Breath sounds: No stridor.  Abdominal:     General: Abdomen is flat. There is no distension.     Palpations: Abdomen is soft. There is no mass.     Tenderness: There is no abdominal tenderness. There is no guarding or rebound.     Hernia: No hernia is present.     Comments: Incision healing well without infection.  There is normal inflammatory response without evidence of erythema.  No evidence of hernia recurrence  Skin:    Capillary Refill: Capillary refill takes less than 2 seconds.  Neurological:     General: No focal deficit present.     Mental Status: He is alert and oriented to person, place, and time.  Psychiatric:        Mood and Affect: Mood normal.        Behavior: Behavior normal.        Thought Content: Thought content normal.        Judgment: Judgment normal.  Assessment/Plan: 51 year old male status post umbilical hernia repair with mesh doing very well without complications.  Still needs to follow lifting restrictions for full 6 weeks.  Other than that may return to other activities and regular life.  I will see him on a as needed basis. I spent 20 minutes in this encounter reviewing medical record, counseling the patient and performing appropriate documentation   Caroleen Hamman, MD [place] Surgeon
